# Patient Record
Sex: Female | Born: 1985 | Race: Black or African American | Hispanic: No | Marital: Married | Smoking: Never smoker
Health system: Southern US, Community
[De-identification: ages and names within clinical notes are randomized; demographics above are authoritative.]

## PROBLEM LIST (undated history)

## (undated) ENCOUNTER — Inpatient Hospital Stay (HOSPITAL_COMMUNITY): Payer: Self-pay

## (undated) DIAGNOSIS — M069 Rheumatoid arthritis, unspecified: Secondary | ICD-10-CM

## (undated) DIAGNOSIS — R87629 Unspecified abnormal cytological findings in specimens from vagina: Secondary | ICD-10-CM

## (undated) DIAGNOSIS — IMO0002 Reserved for concepts with insufficient information to code with codable children: Secondary | ICD-10-CM

## (undated) DIAGNOSIS — E559 Vitamin D deficiency, unspecified: Secondary | ICD-10-CM

## (undated) DIAGNOSIS — M329 Systemic lupus erythematosus, unspecified: Secondary | ICD-10-CM

## (undated) HISTORY — DX: Systemic lupus erythematosus, unspecified: M32.9

## (undated) HISTORY — DX: Reserved for concepts with insufficient information to code with codable children: IMO0002

## (undated) HISTORY — PX: MYRINGOTOMY: SUR874

## (undated) HISTORY — PX: BARTHOLIN GLAND CYST EXCISION: SHX565

## (undated) HISTORY — PX: WISDOM TOOTH EXTRACTION: SHX21

## (undated) HISTORY — PX: I & D EXTREMITY: SHX5045

---

## 2013-06-28 ENCOUNTER — Ambulatory Visit: Payer: BC Managed Care – PPO | Admitting: Family Medicine

## 2013-06-28 VITALS — BP 114/76 | HR 76 | Temp 98.6°F | Resp 16 | Ht 62.0 in | Wt 152.4 lb

## 2013-06-28 DIAGNOSIS — R300 Dysuria: Secondary | ICD-10-CM

## 2013-06-28 DIAGNOSIS — R350 Frequency of micturition: Secondary | ICD-10-CM

## 2013-06-28 LAB — POCT UA - MICROSCOPIC ONLY

## 2013-06-28 LAB — POCT URINE PREGNANCY: Preg Test, Ur: NEGATIVE

## 2013-06-28 LAB — POCT URINALYSIS DIPSTICK
Glucose, UA: 250
Spec Grav, UA: 1.015
pH, UA: 5

## 2013-06-28 MED ORDER — CIPROFLOXACIN HCL 250 MG PO TABS: 250.0000 mg | ORAL_TABLET | Freq: Two times a day (BID) | ORAL | Status: DC

## 2013-06-28 NOTE — Progress Notes (Signed)
Patient ID: Sheila Bates MRN: [id number], DOB: 06-30-1986, 27 y.o. Date of Encounter: 06/28/2013, 6:39 PM  Primary Physician: Michail Sermon, MD  Chief Complaint:  Chief Complaint  Patient presents with  . 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 Last UTI was 6 years ago  hematuria associated with menses LMP: now No sick contacts, recent antibiotics, or recent travels.   vaginal discharge, back pain  No past medical history on file.   Home Meds: Prior to Admission medications   Not on File    Allergies: No Known Allergies  History   Social History  . Marital Status: Married    Spouse Name: N/A    Number of Children: N/A  . Years of Education: N/A   Occupational History  . Not on file.   Social History Main Topics  . Smoking status: Never Smoker   . Smokeless tobacco: Not on file  . Alcohol Use: Not on file  . Drug Use: Not on file  . Sexual Activity: Not on file   Other Topics Concern  . Not on file   Social History Narrative  . No narrative on file     Review of Systems: Constitutional: negative for chills, fever, night sweats or weight changes Cardiovascular: negative for chest pain or palpitations Respiratory: negative for hemoptysis, wheezing, or shortness of breath Abdominal: negative for abdominal pain, nausea, vomiting or diarrhea Dermatological: negative for rash Neurologic: negative for headache   Physical Exam: Blood pressure 114/76, pulse 76, temperature 98.6 F (37 C), temperature source Oral, resp. rate 16, height 5\' 2"  (1.575 m), weight 152 lb 6.4 oz (69.128 kg), last menstrual period 06/03/2013, SpO2 98.00%., Body mass index is 27.87 kg/(m^2). General: Well developed, well nourished, in no acute distress. Head: Normocephalic, atraumatic, eyes without discharge, sclera  non-icteric, nares are congested. Bilateral auditory canals clear, TM's are without perforation, pearly grey with reflective cone of light bilaterally. Serous effusion bilaterally behind TM's. Maxillary sinus TTP. Oral cavity moist, dentition normal. Posterior pharynx with post nasal drip and mild erythema. No peritonsillar abscess or tonsillar exudate. Neck: Supple. No thyromegaly. Full ROM. No lymphadenopathy. Lungs: Coarse breath sounds bilaterally without Clear bilaterally to auscultation without wheezes, rales, or rhonchi. Breathing is unlabored.  Heart: RRR with S1 S2. No murmurs, rubs, or gallops appreciated. Abdomen: Soft, non-tender, non-distended with normoactive bowel sounds. No hepatosplenomegaly. No rebound/guarding. No obvious abdominal masses. McBurney's, Rovsing's, Iliopsoas, and table jar all negative. Msk:  Strength and tone normal for age. Extremities: No clubbing or cyanosis. No edema. Neuro: Alert and oriented X 3. Moves all extremities spontaneously. CNII-XII grossly in tact. Psych:  Responds to questions appropriately with a normal affect.   Labs: Results for orders placed in visit on 06/28/13  POCT UA - MICROSCOPIC ONLY      Result Value Range   WBC, Ur, HPF, POC tntc     RBC, urine, microscopic tntc     Bacteria, U Microscopic 3+     Mucus, UA trace     Epithelial cells, urine per micros 0-3     Crystals, Ur, HPF, POC neg     Casts, Ur, LPF, POC neg     Yeast, UA pos    POCT URINALYSIS DIPSTICK      Result Value Range   Color, UA orange  Clarity, UA turbin     Glucose, UA 250     Bilirubin, UA small     Ketones, UA 15     Spec Grav, UA 1.015     Blood, UA large     pH, UA 5.0     Protein, UA 100     Urobilinogen, UA 4.0     Nitrite, UA pos     Leukocytes, UA large (3+)    POCT URINE PREGNANCY      Result Value Range   Preg Test, Ur Negative        ASSESSMENT AND PLAN:  27 y.o. year old female with  - -Mucinex -Tylenol/Motrin  prn -Rest/fluids -RTC precautions -RTC 3-5 days if no improvement  Signed, Elvina Sidle, MD 06/28/2013 6:39 PM

## 2013-06-28 NOTE — Patient Instructions (Addendum)
Urinary Tract Infection  Urinary tract infections (UTIs) can develop anywhere along your urinary tract. Your urinary tract is your body's drainage system for removing wastes and extra water. Your urinary tract includes two kidneys, two ureters, a bladder, and a urethra. Your kidneys are a pair of bean-shaped organs. Each kidney is about the size of your fist. They are located below your ribs, one on each side of your spine.  CAUSES  Infections are caused by microbes, which are microscopic organisms, including fungi, viruses, and bacteria. These organisms are so small that they can only be seen through a microscope. Bacteria are the microbes that most commonly cause UTIs.  SYMPTOMS   Symptoms of UTIs may vary by age and gender of the patient and by the location of the infection. Symptoms in young women typically include a frequent and intense urge to urinate and a painful, burning feeling in the bladder or urethra during urination. Older women and men are more likely to be tired, shaky, and weak and have muscle aches and abdominal pain. A fever may mean the infection is in your kidneys. Other symptoms of a kidney infection include pain in your back or sides below the ribs, nausea, and vomiting.  DIAGNOSIS  To diagnose a UTI, your caregiver will ask you about your symptoms. Your caregiver also will ask to provide a urine sample. The urine sample will be tested for bacteria and white blood cells. White blood cells are made by your body to help fight infection.  TREATMENT   Typically, UTIs can be treated with medication. Because most UTIs are caused by a bacterial infection, they usually can be treated with the use of antibiotics. The choice of antibiotic and length of treatment depend on your symptoms and the type of bacteria causing your infection.  HOME CARE INSTRUCTIONS   If you were prescribed antibiotics, take them exactly as your caregiver instructs you. Finish the medication even if you feel better after you  have only taken some of the medication.   Drink enough water and fluids to keep your urine clear or pale yellow.   Avoid caffeine, tea, and carbonated beverages. They tend to irritate your bladder.   Empty your bladder often. Avoid holding urine for long periods of time.   Empty your bladder before and after sexual intercourse.   After a bowel movement, women should cleanse from front to back. Use each tissue only once.  SEEK MEDICAL CARE IF:    You have back pain.   You develop a fever.   Your symptoms do not begin to resolve within 3 days.  SEEK IMMEDIATE MEDICAL CARE IF:    You have severe back pain or lower abdominal pain.   You develop chills.   You have nausea or vomiting.   You have continued burning or discomfort with urination.  MAKE SURE YOU:    Understand these instructions.   Will watch your condition.   Will get help right away if you are not doing well or get worse.  Document Released: 07/31/2005 Document Revised: 04/21/2012 Document Reviewed: 11/29/2011  [employer] Patient Information 2014 [employer].

## 2013-09-20 ENCOUNTER — Ambulatory Visit: Payer: BC Managed Care – PPO | Admitting: Physician Assistant

## 2013-09-20 VITALS — BP 124/78 | HR 103 | Temp 98.1°F | Resp 16 | Ht 62.0 in | Wt 159.6 lb

## 2013-09-20 DIAGNOSIS — M329 Systemic lupus erythematosus, unspecified: Secondary | ICD-10-CM | POA: Insufficient documentation

## 2013-09-20 DIAGNOSIS — N926 Irregular menstruation, unspecified: Secondary | ICD-10-CM

## 2013-09-20 DIAGNOSIS — IMO0002 Reserved for concepts with insufficient information to code with codable children: Secondary | ICD-10-CM | POA: Insufficient documentation

## 2013-09-20 LAB — POCT URINE PREGNANCY: Preg Test, Ur: POSITIVE

## 2013-09-20 NOTE — Progress Notes (Signed)
  Subjective:    Patient ID: Sheila Bates, female    DOB: 1986/02/14, 27 y.o.   MRN: [id number]  HPI   Sheila Bates is a very pleasant 27 yr old female here requesting a pregnancy test.  She has had a positive home pregnancy test.  LMP 08/13/13.  She is married.  They stopped using birth control in May but have not necessarily been actively trying to conceive.  OBGYN is Dr. Loretha Brasil at [facility].  Pt reports she is high risk due to lupus.  Trying to get into [facility] but needs clinical confirmation of pregnancy faxed to that office prior to scheduling.  Reports rheumatologist is Dr. Enedina Finner in [place].  Not currently treated with any medication for lupus.  States she is feeling well.  Some mild nausea and light abd cramping.  She is taking prenatal vitamins.  Did drink alcohol 3 days ago before +pregnancy test, but is now abstaining.  Non smoker.  Denies vaginal bleeding, loss of fluids.  Needs results faxed to: [facility] Fax [phone]  Review of Systems  Constitutional: Negative for fever and chills.  Respiratory: Negative.   Cardiovascular: Negative.   Gastrointestinal: Positive for nausea.  Genitourinary: Negative for vaginal bleeding, vaginal discharge and pelvic pain.  Musculoskeletal: Negative.   Skin: Negative.   Neurological: Negative.        Objective:   Physical Exam  Vitals reviewed. Constitutional: She is oriented to person, place, and time. She appears well-developed and well-nourished. No distress.  HENT:  Head: Normocephalic and atraumatic.  Eyes: Conjunctivae are normal. No scleral icterus.  Cardiovascular: Normal rate, regular rhythm and normal heart sounds.   Pulmonary/Chest: Effort normal and breath sounds normal. She has no wheezes. She has no rales.  Abdominal: Soft. There is no tenderness.  Neurological: She is alert and oriented to person, place, and time.  Skin: Skin is warm and dry.  Psychiatric: She has a normal mood and affect.  Her behavior is normal.    Results for orders placed in visit on 09/20/13  POCT URINE PREGNANCY      Result Value Range   Preg Test, Ur Positive         Assessment & Plan:  Missed period - Plan: POCT urine pregnancy  Lupus   Sheila Bates is a very pleasant 27 yr old female here with amenorrhea and a positive home pregnancy test.  Urine hcg in clinic today is also positive.  Pt reports a history of lupus making her pregnancy high risk.  Attempting to make an appt with [facility].  Pt currently feels well aside from some mild nausea.  Discussed avoidance of etoh, tobacco.  Continue prenatal vit daily.  Pt to RTC if concerns arise prior to establish with OBGYN.  Loleta Dicker MHS, PA-C [facility] 11/17/20143:37 PM

## 2013-09-20 NOTE — Patient Instructions (Signed)
Continue taking daily prenatal vitamins.  Drink plenty of water.  Make healthy dietary choices.  Avoid alcohol and tobacco.  Please come back in if you experience abdominal pain, vaginal bleeding, etc.   Pregnancy If you are planning on getting pregnant, it is a good idea to make a preconception appointment with your caregiver to discuss having a healthy lifestyle before getting pregnant. This includes diet, weight, exercise, taking prenatal vitamins (especially folic acid, which helps prevent brain and spinal cord defects), avoiding alcohol, smoking and illegal drugs, medical problems (diabetes, convulsions), family history of genetic problems, working conditions, and immunizations. It is better to have knowledge of these things and do something about them before getting pregnant. During your pregnancy, it is important to follow certain guidelines in order to have a healthy baby. It is very important to get good prenatal care and follow your caregiver's instructions. Prenatal care includes all the medical care you receive before your baby's birth. This helps to prevent problems during the pregnancy and childbirth. HOME CARE INSTRUCTIONS   Start your prenatal visits by the 12th week of pregnancy or earlier, if possible. At first, appointments are usually scheduled monthly. They become more frequent in the last 2 months before delivery. It is important that you keep your caregiver's appointments and follow your caregiver's instructions regarding medication use, exercise, and diet.  During pregnancy, you are providing food for you and your baby. Eat a regular, well-balanced diet. Choose foods such as meat, fish, milk and other dairy products, vegetables, fruits, whole-grain breads and cereals. Your caregiver will inform you of the ideal weight gain depending on your current height and weight. Drink lots of liquids. Try to drink 8 glasses of water a day.  Alcohol is associated with a number of birth defects  including fetal alcohol syndrome. It is best to avoid alcohol completely. Smoking will cause low birth rate and prematurity. Use of alcohol and nicotine during your pregnancy also increases the chances that your child will be chemically dependent later in their life and may contribute to SIDS (Sudden Infant Death Syndrome).  Do not use illegal drugs.  Only take prescription or over-the-counter medications that are recommended by your caregiver. Other medications can cause genetic and physical problems in the baby.  Morning sickness can often be helped by keeping soda crackers at the bedside. Eat a few before getting up in the morning.  A sexual relationship may be continued until near the end of pregnancy if there are no other problems such as early (premature) leaking of amniotic fluid from the membranes, vaginal bleeding, painful intercourse or belly (abdominal) pain.  Exercise regularly. Check with your caregiver if you are unsure of the safety of some of your exercises.  Do not use hot tubs, steam rooms or saunas. These increase the risk of fainting and hurting yourself and the baby. Swimming is OK for exercise. Get plenty of rest, including afternoon naps when possible, especially in the third trimester.  Avoid toxic odors and chemicals.  Do not wear high heels. They may cause you to lose your balance and fall.  Do not lift over 5 pounds. If you do lift anything, lift with your legs and thighs, not your back.  Avoid long trips, especially in the third trimester.  If you have to travel out of the city or state, take a copy of your medical records with you. SEEK IMMEDIATE MEDICAL CARE IF:   You develop an unexplained oral temperature above 102 F (38.9 C), or  as your caregiver suggests.  You have leaking of fluid from the vagina. If leaking membranes are suspected, take your temperature and inform your caregiver of this when you call.  There is vaginal spotting or bleeding. Notify  your caregiver of the amount and how many pads are used.  You continue to feel sick to your stomach (nauseous) and have no relief from remedies suggested, or you throw up (vomit) blood or coffee ground like materials.  You develop upper abdominal pain.  You have round ligament discomfort in the lower abdominal area. This still must be evaluated by your caregiver.  You feel contractions of the uterus.  You do not feel the baby move, or there is less movement than before.  You have painful urination.  You have abnormal vaginal discharge.  You have persistent diarrhea.  You get a severe headache.  You have problems with your vision.  You develop muscle weakness.  You feel dizzy and faint.  You develop shortness of breath.  You develop chest pain.  You have back pain that travels down to your leg and feet.  You feel irregular or a very fast heartbeat.  You develop excessive weight gain in a short period of time (5 pounds in 3 to 5 days).  You are involved in a domestic violence situation. Document Released: 10/21/2005 Document Revised: 04/21/2012 Document Reviewed: 04/14/2009 [facility] Patient Information 2014 [place], [place].

## 2013-10-08 LAB — OB RESULTS CONSOLE HIV ANTIBODY (ROUTINE TESTING): HIV: NONREACTIVE

## 2013-10-08 LAB — OB RESULTS CONSOLE ABO/RH: RH TYPE: POSITIVE

## 2013-10-08 LAB — OB RESULTS CONSOLE RPR: RPR: NONREACTIVE

## 2013-10-08 LAB — OB RESULTS CONSOLE RUBELLA ANTIBODY, IGM: Rubella: IMMUNE

## 2013-10-08 LAB — OB RESULTS CONSOLE HEPATITIS B SURFACE ANTIGEN: HEP B S AG: NEGATIVE

## 2013-10-08 LAB — OB RESULTS CONSOLE ANTIBODY SCREEN: ANTIBODY SCREEN: NEGATIVE

## 2013-11-18 ENCOUNTER — Ambulatory Visit: Payer: BC Managed Care – PPO | Admitting: Physician Assistant

## 2013-11-18 VITALS — BP 102/60 | HR 89 | Temp 98.9°F | Resp 18 | Ht 62.5 in | Wt 156.0 lb

## 2013-11-18 DIAGNOSIS — B9789 Other viral agents as the cause of diseases classified elsewhere: Principal | ICD-10-CM

## 2013-11-18 DIAGNOSIS — J069 Acute upper respiratory infection, unspecified: Secondary | ICD-10-CM

## 2013-11-18 MED ORDER — IPRATROPIUM BROMIDE 0.03 % NA SOLN: 2.0000 | Freq: Two times a day (BID) | NASAL | Status: DC

## 2013-11-18 MED ORDER — HYDROCODONE-HOMATROPINE 5-1.5 MG/5ML PO SYRP: 5.0000 mL | ORAL_SOLUTION | Freq: Three times a day (TID) | ORAL | Status: DC | PRN

## 2013-11-18 MED ORDER — DEXTROMETHORPHAN-GUAIFENESIN 10-100 MG/5ML PO LIQD: 5.0000 mL | ORAL | Status: DC | PRN

## 2013-11-18 NOTE — Progress Notes (Signed)
   Subjective:    Patient ID: Sheila Bates, female    DOB: [birth year], 28 y.o.   MRN: [id number]  HPI   Ms. Johal is a very pleasant 28 yr old female here with concern for illness.  Reports 1 wk of congested nose, sore throat, productive cough.  Has CP with cough now.  Cough keeps awake at night.  No fever, chills.  No GI symptoms.  Has been using Tylenol Cold and Sinus with some relief.  +sick contacts at work.  Pt is [redacted] wks pregnant   Review of Systems  Constitutional: Negative for fever and chills.  HENT: Positive for congestion, rhinorrhea and sore throat. Negative for ear pain.   Respiratory: Positive for cough. Negative for shortness of breath and wheezing.   Cardiovascular: Negative.   Gastrointestinal: Negative.   Musculoskeletal: Negative.   Skin: Negative.        Objective:   Physical Exam  Vitals reviewed. Constitutional: She is oriented to person, place, and time. She appears well-developed and well-nourished. No distress.  HENT:  Head: Normocephalic and atraumatic.  Right Ear: Tympanic membrane and ear canal normal.  Left Ear: Tympanic membrane and ear canal normal.  Mouth/Throat: Uvula is midline, oropharynx is clear and moist and mucous membranes are normal.  Eyes: Conjunctivae are normal. No scleral icterus.  Neck: Neck supple.  Cardiovascular: Normal rate, regular rhythm and normal heart sounds.   Pulmonary/Chest: Effort normal and breath sounds normal. She has no wheezes. She has no rales.  Lymphadenopathy:    She has no cervical adenopathy.  Neurological: She is alert and oriented to person, place, and time.  Skin: Skin is warm and dry.  Psychiatric: She has a normal mood and affect. Her behavior is normal.        Assessment & Plan:  Viral URI with cough - Plan: ipratropium (ATROVENT) 0.03 % nasal spray, HYDROcodone-homatropine (HYCODAN) 5-1.5 MG/5ML syrup, dextromethorphan-guaiFENesin (ROBITUSSIN-DM) 10-100 MG/5ML liquid   Ms. Lavoy is a pleasant  28 yr old female here with 1 wk of URI symptoms.  Nasal symptoms resolving, now cough most bothersome.  Suspect viral etiology. Afebrile, throat care, lungs CTA.  Will treat symptomatically at this point.  Atrovent nasal, robitussin dm.  Hycodan if needed for night time cough - discussed with pt that this is pregnancy category C.  Pt understands and agrees with this plan.  If worsening or not improving in 3-4 days, would treat for sinusitis with augmentin   E. Frances Furbish MHS, PA-C [facility] 1/15/20159:50 PM

## 2013-11-18 NOTE — Patient Instructions (Signed)
Begin using the ipratropium nasal spray 2-3 times per day to help with congestion, runny nose, and post-nasal drainage.  Use the dextromethorphan-guaifenesin every 6 hours to help with cough - this will also help clear up congestion  If cough is really bothersome at night, you can use the Hycodan syrup - this is pregnancy category C, so would prefer to limit this if possible  Drink plenty of fluids (water is best!) and get plenty of rest  If any symptoms are worsening or not improving in the next 3-4 days please call   Upper Respiratory Infection, Adult An upper respiratory infection (URI) is also sometimes known as the common cold. The upper respiratory tract includes the nose, sinuses, throat, trachea, and bronchi. Bronchi are the airways leading to the lungs. Most people improve within 1 week, but symptoms can last up to 2 weeks. A residual cough may last even longer.  CAUSES Many different viruses can infect the tissues lining the upper respiratory tract. The tissues become irritated and inflamed and often become very moist. Mucus production is also common. A cold is contagious. You can easily spread the virus to others by oral contact. This includes kissing, sharing a glass, coughing, or sneezing. Touching your mouth or nose and then touching a surface, which is then touched by another person, can also spread the virus. SYMPTOMS  Symptoms typically develop 1 to 3 days after you come in contact with a cold virus. Symptoms vary from person to person. They may include:  Runny nose.  Sneezing.  Nasal congestion.  Sinus irritation.  Sore throat.  Loss of voice (laryngitis).  Cough.  Fatigue.  Muscle aches.  Loss of appetite.  Headache.  Low-grade fever. DIAGNOSIS  You might diagnose your own cold based on familiar symptoms, since most people get a cold 2 to 3 times a year. Your caregiver can confirm this based on your exam. Most importantly, your caregiver can check that  your symptoms are not due to another disease such as strep throat, sinusitis, pneumonia, asthma, or epiglottitis. Blood tests, throat tests, and X-rays are not necessary to diagnose a common cold, but they may sometimes be helpful in excluding other more serious diseases. Your caregiver will decide if any further tests are required. RISKS AND COMPLICATIONS  You may be at risk for a more severe case of the common cold if you smoke cigarettes, have chronic heart disease (such as heart failure) or lung disease (such as asthma), or if you have a weakened immune system. The very young and very old are also at risk for more serious infections. Bacterial sinusitis, middle ear infections, and bacterial pneumonia can complicate the common cold. The common cold can worsen asthma and chronic obstructive pulmonary disease (COPD). Sometimes, these complications can require emergency medical care and may be life-threatening. PREVENTION  The best way to protect against getting a cold is to practice good hygiene. Avoid oral or hand contact with people with cold symptoms. Wash your hands often if contact occurs. There is no clear evidence that vitamin C, vitamin E, echinacea, or exercise reduces the chance of developing a cold. However, it is always recommended to get plenty of rest and practice good nutrition. TREATMENT  Treatment is directed at relieving symptoms. There is no cure. Antibiotics are not effective, because the infection is caused by a virus, not by bacteria. Treatment may include:  Increased fluid intake. Sports drinks offer valuable electrolytes, sugars, and fluids.  Breathing heated mist or steam (vaporizer or shower).  Eating chicken soup or other clear broths, and maintaining good nutrition.  Getting plenty of rest.  Using gargles or lozenges for comfort.  Controlling fevers with ibuprofen or acetaminophen as directed by your caregiver.  Increasing usage of your inhaler if you have  asthma. Zinc gel and zinc lozenges, taken in the first 24 hours of the common cold, can shorten the duration and lessen the severity of symptoms. Pain medicines may help with fever, muscle aches, and throat pain. A variety of non-prescription medicines are available to treat congestion and runny nose. Your caregiver can make recommendations and may suggest nasal or lung inhalers for other symptoms.  HOME CARE INSTRUCTIONS   Only take over-the-counter or prescription medicines for pain, discomfort, or fever as directed by your caregiver.  Use a warm mist humidifier or inhale steam from a shower to increase air moisture. This may keep secretions moist and make it easier to breathe.  Drink enough water and fluids to keep your urine clear or pale yellow.  Rest as needed.  Return to work when your temperature has returned to normal or as your caregiver advises. You may need to stay home longer to avoid infecting others. You can also use a face mask and careful hand washing to prevent spread of the virus. SEEK MEDICAL CARE IF:   After the first few days, you feel you are getting worse rather than better.  You need your caregiver's advice about medicines to control symptoms.  You develop chills, worsening shortness of breath, or brown or red sputum. These may be signs of pneumonia.  You develop yellow or brown nasal discharge or pain in the face, especially when you bend forward. These may be signs of sinusitis.  You develop a fever, swollen neck glands, pain with swallowing, or white areas in the back of your throat. These may be signs of strep throat. SEEK IMMEDIATE MEDICAL CARE IF:   You have a fever.  You develop severe or persistent headache, ear pain, sinus pain, or chest pain.  You develop wheezing, a prolonged cough, cough up blood, or have a change in your usual mucus (if you have chronic lung disease).  You develop sore muscles or a stiff neck. Document Released: 04/16/2001  Document Revised: 01/13/2012 Document Reviewed: 02/22/2011 [facility] Patient Information 2014 [place], [place].

## 2013-12-15 LAB — OB RESULTS CONSOLE GC/CHLAMYDIA
Chlamydia: NEGATIVE
Gonorrhea: NEGATIVE

## 2014-01-05 ENCOUNTER — Ambulatory Visit (INDEPENDENT_AMBULATORY_CARE_PROVIDER_SITE_OTHER): Payer: BC Managed Care – PPO | Admitting: General Surgery

## 2014-01-05 ENCOUNTER — Ambulatory Visit: Payer: BC Managed Care – PPO | Admitting: Family Medicine

## 2014-01-05 ENCOUNTER — Encounter (INDEPENDENT_AMBULATORY_CARE_PROVIDER_SITE_OTHER): Payer: Self-pay

## 2014-01-05 ENCOUNTER — Encounter (INDEPENDENT_AMBULATORY_CARE_PROVIDER_SITE_OTHER): Payer: Self-pay | Admitting: General Surgery

## 2014-01-05 VITALS — BP 100/60 | HR 84 | Temp 98.1°F | Resp 18 | Ht 62.0 in | Wt 167.0 lb

## 2014-01-05 VITALS — BP 122/84 | HR 74 | Temp 97.7°F | Resp 16 | Ht 63.0 in | Wt 169.8 lb

## 2014-01-05 DIAGNOSIS — L02419 Cutaneous abscess of limb, unspecified: Secondary | ICD-10-CM

## 2014-01-05 DIAGNOSIS — L0291 Cutaneous abscess, unspecified: Secondary | ICD-10-CM

## 2014-01-05 DIAGNOSIS — L0390 Cellulitis, unspecified: Principal | ICD-10-CM

## 2014-01-05 DIAGNOSIS — L03119 Cellulitis of unspecified part of limb: Principal | ICD-10-CM

## 2014-01-05 LAB — POCT CBC
GRANULOCYTE PERCENT: 81.8 % — AB (ref 37–80)
HCT, POC: 37.8 % (ref 37.7–47.9)
Hemoglobin: 11.7 g/dL — AB (ref 12.2–16.2)
LYMPH, POC: 1.7 (ref 0.6–3.4)
MCH: 29.3 pg (ref 27–31.2)
MCHC: 31 g/dL — AB (ref 31.8–35.4)
MCV: 94.8 fL (ref 80–97)
MID (cbc): 0.8 (ref 0–0.9)
MPV: 9.1 fL (ref 0–99.8)
POC Granulocyte: 11.5 — AB (ref 2–6.9)
POC LYMPH %: 12.4 % (ref 10–50)
POC MID %: 5.8 %M (ref 0–12)
Platelet Count, POC: 240 10*3/uL (ref 142–424)
RBC: 3.99 M/uL — AB (ref 4.04–5.48)
RDW, POC: 14.3 %
WBC: 14.1 10*3/uL — AB (ref 4.6–10.2)

## 2014-01-05 MED ORDER — CEPHALEXIN 500 MG PO CAPS: 500.0000 mg | ORAL_CAPSULE | Freq: Four times a day (QID) | ORAL | Status: DC

## 2014-01-05 NOTE — Patient Instructions (Signed)
Home Instructions Following Incision and Drainage of Abscess  Wound care - A dressing has been applied to control any bleeding or drainage immediately after your procedure. There is packing inside your wound as well that should be removed with the dressing.  You need to repack the area.  After the dressing is removed, clean the area gently with a mild soap and warm water and pack with guaze.  Cover with a dry dressing.   - Call the office if the surrounding redness doesn't get better over the next couple.      Diet -Eat a regular diet.  Avoid foods that may constipate you or give you diarrhea.  Drink 6-8 glasses of water a day and avoid seeds, nuts and popcorn until the area heals.  Medication - We recommend Extra Strength Tylenol for mild to moderate pain.  This can be taken as instructed on the bottle.   - If you are given a prescription for antibiotics, take as instructed by your doctor until the entire course is completed  Activity Resume activities as tolerated beginning tomorrow.  Avoid strenuous activities or sports for one week.    Call the office if you have any questions.  Call IMMEDIATELY if you should develop persistent heavy rectal bleeding, increase in pain, difficulty urinating or fever greater than 100 F.

## 2014-01-05 NOTE — Patient Instructions (Addendum)
We are going to have you see general surgery today due to your abscess in the left groin.  Please go to the office of [facility] by 315. Please bring your insurance card and photo ID.  You will see Dr. Maisie Fus [address] [place], [place] [address] phone: [phone]

## 2014-01-05 NOTE — Progress Notes (Signed)
Chief Complaint  Patient presents with  . Other  . Pain    HISTORY:  Sheila Bates is a 28 y.o. female who presents to the office with an enalrging L inner thigh mass.  She noticed a cyst that opened up and drained about 2 weeks ago.  It came back yesterday.  It is tender to Bates.  She has had one episode of this in the past.  She does have a h/o lupus and is [redacted] wks pregnant.  She is on steroids for her lupus during pregnancy.    Past Medical History  Diagnosis Date  . Lupus        History reviewed. No pertinent past surgical history.    Current Outpatient Prescriptions  Medication Sig Dispense Refill  . aspirin 81 MG tablet Take 81 mg by mouth daily.      Marland Kitchen dexamethasone (DECADRON) 4 MG tablet Take 4 mg by mouth 2 (two) times daily with a meal.      . dextromethorphan-guaiFENesin (ROBITUSSIN-DM) 10-100 MG/5ML liquid Take 5 mLs by mouth every 4 (four) hours as needed for cough.  120 mL  0  . HYDROcodone-homatropine (HYCODAN) 5-1.5 MG/5ML syrup Take 5 mLs by mouth every 8 (eight) hours as needed for cough.  30 mL  0  . hydroxychloroquine (PLAQUENIL) 200 MG tablet Take 400 mg by mouth daily.      Marland Kitchen ipratropium (ATROVENT) 0.03 % nasal spray Place 2 sprays into the nose 2 (two) times daily.  30 mL  1  . Prenatal Vit-Fe Fumarate-FA (PRENATAL MULTIVITAMIN) TABS tablet Take 1 tablet by mouth daily at 12 noon.      . pseudoephedrine-acetaminophen (TYLENOL SINUS) 30-500 MG TABS Take 1 tablet by mouth every 4 (four) hours as needed.      . cephALEXin (KEFLEX) 500 MG capsule Take 1 capsule (500 mg total) by mouth 4 (four) times daily.  40 capsule  0   No current facility-administered medications for this visit.     No Known Allergies    History reviewed. No pertinent family history.    History   Social History  . Marital Status: Married    Spouse Name: N/A    Number of Children: N/A  . Years of Education: N/A   Social History Main Topics  . Smoking status: Never Smoker   .  Smokeless tobacco: None  . Alcohol Use: None  . Drug Use: None  . Sexual Activity: None   Other Topics Concern  . None   Social History Narrative  . None       REVIEW OF SYSTEMS - PERTINENT POSITIVES ONLY: Review of Systems - General ROS: negative for - chills or fever Hematological and Lymphatic ROS: negative for - bleeding problems or blood clots Respiratory ROS: no cough, shortness of breath, or wheezing Cardiovascular ROS: no chest pain or dyspnea on exertion Gastrointestinal ROS: no abdominal pain, change in bowel habits, or black or bloody stools Genito-Urinary ROS: no dysuria, trouble voiding, or hematuria Musculoskeletal ROS: negative for - joint pain, joint stiffness or joint swelling  EXAM: Filed Vitals:   01/05/14 1547  BP: 122/84  Pulse: 74  Temp: 97.7 F (36.5 C)  Resp: 16    General appearance: alert and cooperative Resp: clear to auscultation bilaterally Cardio: regular rate and rhythm GI: normal findings: soft, non-tender Erythematous mass in inner L thigh.  Fluctuance noted.    LABORATORY RESULTS: Available labs are reviewed  Lab Results  Component Value Date   WBC 14.1*  01/05/2014   HGB 11.7* 01/05/2014   HCT 37.8 01/05/2014   MCV 94.8 01/05/2014   Procedure: I&D Surgeon: Maisie Fus Assistant: Morris After the risks and benefits were explained, verbal consent was obtained for above procedure  Anesthesia: none Diagnosis: L thigh abscess Description: The area over the abscess was prepped with ChloraPrep.  The area was then infused with subcutaneous Marcaine. A cruciate incision was made using 11 blade scalpel. The corners were trimmed off.  A large amount of purulence was removed. The cavity was then packed with 1 inch iodoform gauze. A dressing was applied. The patient tolerated the procedure well. Discharge instructions were given.   ASSESSMENT AND PLAN: Sheila Bates is a 28 y.o. F who is pregnant and has lupus who presents with an enlarging L thigh  abscess.  We have performed an I&D in the office. I've asked her to pack this wound daily. I have given her a prescription for Keflex.  I will see her back in 2 weeks.    Vanita Panda, MD Colon and Rectal Surgery / General Surgery [facility]      Visit Diagnoses: 1. Cellulitis and abscess     Primary Care Physician: Michail Sermon, MD

## 2014-01-05 NOTE — Progress Notes (Addendum)
[facility] [address], [place] [address] [id number] [phone]  Date:  01/05/2014   Name:  Sheila Bates   DOB:  04/05/1986   MRN:  [id number]  PCP:  Michail Sermon, MD    Chief Complaint: Recurrent Skin Infections   History of Present Illness:  Sheila Bates is a 28 y.o. very pleasant female patient who presents with the following:  Here today with a "cyst" on her leg.  She has a history of lupus; she is treated per [facility]  She is also pregnant.  She will be 21 weeks tomorrow; her OBG is Birdie Hopes with [facility].   She first noted this "cyst, or a boil" about 2 weeks ago- it then drained and she thought it was better.  However it came back  back again yesterday and is "now huge."  It is quite tender.  She has tried hot compresses but has not been able to get the abscess to drain.  She has not noted any fever.    She otherwise feels well.   She has had a few abscesses  in the past, but has not been bothered by abscesses since her 80s.    She is G1P0. Pregnancy is going well; she is feeling her baby move as normal.  No problems with contractions, bleeding or LOF.  She is expecting a baby girl.   Patient Active Problem List   Diagnosis Date Noted  . Lupus 09/20/2013    Past Medical History  Diagnosis Date  . Lupus     History reviewed. No pertinent past surgical history.  History  Substance Use Topics  . Smoking status: Never Smoker   . Smokeless tobacco: Not on file  . Alcohol Use: Not on file    History reviewed. No pertinent family history.  No Known Allergies  Medication list has been reviewed and updated.  Current Outpatient Prescriptions on File Prior to Visit  Medication Sig Dispense Refill  . dextromethorphan-guaiFENesin (ROBITUSSIN-DM) 10-100 MG/5ML liquid Take 5 mLs by mouth every 4 (four) hours as needed for cough.  120 mL  0  . HYDROcodone-homatropine (HYCODAN) 5-1.5 MG/5ML syrup Take 5 mLs by mouth  every 8 (eight) hours as needed for cough.  30 mL  0  . ipratropium (ATROVENT) 0.03 % nasal spray Place 2 sprays into the nose 2 (two) times daily.  30 mL  1  . pseudoephedrine-acetaminophen (TYLENOL SINUS) 30-500 MG TABS Take 1 tablet by mouth every 4 (four) hours as needed.       No current facility-administered medications on file prior to visit.    Review of Systems:  As per HPI- otherwise negative.   Physical Examination: Filed Vitals:   01/05/14 0911  BP: 100/60  Pulse: 110  Temp: 98.1 F (36.7 C)  Resp: 18   Filed Vitals:   01/05/14 0911  Height: 5\' 2"  (1.575 m)  Weight: 167 lb (75.751 kg)   Body mass index is 30.54 kg/(m^2). Ideal Body Weight: Weight in (lb) to have BMI = 25: 136.4  GEN: WDWN, NAD, Non-toxic, A & O x 3, looks well HEENT: Atraumatic, Normocephalic. Neck supple. No masses, No LAD. Ears and Nose: No external deformity. CV: RRR, No M/G/R. No JVD. No thrill. No extra heart sounds. PULM: CTA B, no wheezes, crackles, rhonchi. No retractions. No resp. distress. No accessory muscle use. ABD: gravid abdomen, normal with fundus just above umbilicus.   EXTR: No c/c/e NEURO Normal gait.  PSYCH:  Normally interactive. Conversant. Not depressed or anxious appearing.  Calm demeanor.  Left groin: she has a tender, warm, indurated area without definite fluctuance.   auscultated fetal heart tones.  Normal, approx 140 BPM  Results for orders placed in visit on 01/05/14  POCT CBC      Result Value Ref Range   WBC 14.1 (*) 4.6 - 10.2 K/uL   Lymph, poc 1.7  0.6 - 3.4   POC LYMPH PERCENT 12.4  10 - 50 %L   MID (cbc) 0.8  0 - 0.9   POC MID % 5.8  0 - 12 %M   POC Granulocyte 11.5 (*) 2 - 6.9   Granulocyte percent 81.8 (*) 37 - 80 %G   RBC 3.99 (*) 4.04 - 5.48 M/uL   Hemoglobin 11.7 (*) 12.2 - 16.2 g/dL   HCT, POC 85.6  31.4 - 47.9 %   MCV 94.8  80 - 97 fL   MCH, POC 29.3  27 - 31.2 pg   MCHC 31.0 (*) 31.8 - 35.4 g/dL   RDW, POC 97.0     Platelet Count, POC 240   142 - 424 K/uL   MPV 9.1  0 - 99.8 fL    Assessment and Plan: Cellulitis and abscess of leg - Plan: POCT CBC  Referral to see general surgery today- appreciate their kind consultation for this nice patient.    Signed Abbe Amsterdam, MD

## 2014-01-26 ENCOUNTER — Encounter (INDEPENDENT_AMBULATORY_CARE_PROVIDER_SITE_OTHER): Payer: BC Managed Care – PPO | Admitting: General Surgery

## 2014-02-14 LAB — OB RESULTS CONSOLE RPR: RPR: NONREACTIVE

## 2014-02-14 LAB — OB RESULTS CONSOLE HIV ANTIBODY (ROUTINE TESTING): HIV: NONREACTIVE

## 2014-04-20 LAB — OB RESULTS CONSOLE GBS: GBS: NEGATIVE

## 2014-05-11 ENCOUNTER — Encounter (HOSPITAL_COMMUNITY): Payer: Self-pay | Admitting: *Deleted

## 2014-05-11 ENCOUNTER — Inpatient Hospital Stay (HOSPITAL_COMMUNITY)
Admission: AD | Admit: 2014-05-11 | Discharge: 2014-05-15 | DRG: 766 | Disposition: A | Discharge status: Home / Self Care | Payer: BC Managed Care – PPO | Source: Ambulatory Visit | Attending: Obstetrics and Gynecology | Admitting: Obstetrics and Gynecology

## 2014-05-11 DIAGNOSIS — O99892 Other specified diseases and conditions complicating childbirth: Secondary | ICD-10-CM | POA: Diagnosis present

## 2014-05-11 DIAGNOSIS — O324XX0 Maternal care for high head at term, not applicable or unspecified: Secondary | ICD-10-CM | POA: Diagnosis present

## 2014-05-11 DIAGNOSIS — Z833 Family history of diabetes mellitus: Secondary | ICD-10-CM

## 2014-05-11 DIAGNOSIS — Z87891 Personal history of nicotine dependence: Secondary | ICD-10-CM

## 2014-05-11 DIAGNOSIS — O4290 Premature rupture of membranes, unspecified as to length of time between rupture and onset of labor, unspecified weeks of gestation: Principal | ICD-10-CM | POA: Diagnosis present

## 2014-05-11 DIAGNOSIS — M329 Systemic lupus erythematosus, unspecified: Secondary | ICD-10-CM

## 2014-05-11 DIAGNOSIS — O9989 Other specified diseases and conditions complicating pregnancy, childbirth and the puerperium: Secondary | ICD-10-CM

## 2014-05-11 DIAGNOSIS — Z803 Family history of malignant neoplasm of breast: Secondary | ICD-10-CM

## 2014-05-11 HISTORY — DX: Unspecified abnormal cytological findings in specimens from vagina: R87.629

## 2014-05-11 LAB — CBC
HEMATOCRIT: 35.8 % — AB (ref 36.0–46.0)
Hemoglobin: 12 g/dL (ref 12.0–15.0)
MCH: 29.9 pg (ref 26.0–34.0)
MCHC: 33.5 g/dL (ref 30.0–36.0)
MCV: 89.1 fL (ref 78.0–100.0)
PLATELETS: 206 10*3/uL (ref 150–400)
RBC: 4.02 MIL/uL (ref 3.87–5.11)
RDW: 12.6 % (ref 11.5–15.5)
WBC: 5.6 10*3/uL (ref 4.0–10.5)

## 2014-05-11 MED ORDER — OXYTOCIN 40 UNITS IN LACTATED RINGERS INFUSION - SIMPLE MED
62.5000 mL/h | INTRAVENOUS | Status: DC
  Filled 2014-05-11: qty 1000

## 2014-05-11 MED ORDER — LACTATED RINGERS IV SOLN
500.0000 mL | INTRAVENOUS | Status: DC | PRN
  Administered 2014-05-12 (×3): 1000 mL via INTRAVENOUS

## 2014-05-11 MED ORDER — HYDROCORTISONE NA SUCCINATE PF 100 MG IJ SOLR
50.0000 mg | Freq: Three times a day (TID) | INTRAMUSCULAR | Status: AC
  Administered 2014-05-11 – 2014-05-13 (×5): 50 mg via INTRAVENOUS
  Filled 2014-05-11 (×6): qty 1

## 2014-05-11 MED ORDER — LIDOCAINE HCL (PF) 1 % IJ SOLN
30.0000 mL | INTRAMUSCULAR | Status: DC | PRN
  Filled 2014-05-11: qty 30

## 2014-05-11 MED ORDER — CITRIC ACID-SODIUM CITRATE 334-500 MG/5ML PO SOLN
30.0000 mL | ORAL | Status: DC | PRN
  Administered 2014-05-12: 30 mL via ORAL
  Filled 2014-05-11: qty 15

## 2014-05-11 MED ORDER — IBUPROFEN 600 MG PO TABS: 600.0000 mg | ORAL_TABLET | Freq: Four times a day (QID) | ORAL | Status: DC | PRN

## 2014-05-11 MED ORDER — ONDANSETRON HCL 4 MG/2ML IJ SOLN: 4.0000 mg | Freq: Four times a day (QID) | INTRAMUSCULAR | Status: DC | PRN

## 2014-05-11 MED ORDER — ACETAMINOPHEN 325 MG PO TABS: 650.0000 mg | ORAL_TABLET | ORAL | Status: DC | PRN

## 2014-05-11 MED ORDER — FLEET ENEMA 7-19 GM/118ML RE ENEM: 1.0000 | ENEMA | RECTAL | Status: DC | PRN

## 2014-05-11 MED ORDER — LACTATED RINGERS IV SOLN
INTRAVENOUS | Status: DC
  Administered 2014-05-11 – 2014-05-12 (×5): via INTRAVENOUS

## 2014-05-11 MED ORDER — OXYTOCIN BOLUS FROM INFUSION
500.0000 mL | INTRAVENOUS | Status: DC
Start: 2014-05-11 — End: 2014-05-13

## 2014-05-11 MED ORDER — TERBUTALINE SULFATE 1 MG/ML IJ SOLN: 0.2500 mg | Freq: Once | INTRAMUSCULAR | Status: AC | PRN

## 2014-05-11 MED ORDER — OXYTOCIN 40 UNITS IN LACTATED RINGERS INFUSION - SIMPLE MED
1.0000 m[IU]/min | INTRAVENOUS | Status: DC
  Administered 2014-05-11: 1 m[IU]/min via INTRAVENOUS
  Filled 2014-05-11: qty 1000

## 2014-05-11 MED ORDER — OXYCODONE-ACETAMINOPHEN 5-325 MG PO TABS: 1.0000 | ORAL_TABLET | ORAL | Status: DC | PRN

## 2014-05-11 NOTE — Progress Notes (Signed)
Patient ID: Sheila Bates, female   DOB: 04-22-1986, 28 y.o.   MRN: [id number] Pt is admitted with SLE and PROM. sHE HAS BEEN PLACED ON PITOCIN AND WILL RECEIVE SOLU CORTEF 50 MG Q 8H X 2 DAYS

## 2014-05-11 NOTE — H&P (Signed)
Sheila Bates, HRITZ NO.:  [id number]  MEDICAL RECORD NO.:  [id number]  LOCATION:  9164                          FACILITY:  [facility]  PHYSICIAN:  Malachi Pro. Ambrose Mantle, M.D. DATE OF BIRTH:  [birth year]  DATE OF ADMISSION:  05/11/2014 DATE OF DISCHARGE:                             HISTORY & PHYSICAL   PRESENT ILLNESS:  This is a 28 year old black female, para 0-0-1-0, gravida 2, [facility] May 20, 2014, admitted with premature rupture of the membranes.  Blood group and type O positive.  Negative antibody.  RPR negative.  Urine culture negative.  Hepatitis B surface antigen negative.  HIV negative.  GC and Chlamydia negative.  Rubella immune. Hemoglobin electrophoresis AA.  Pap smear normal.  Cystic fibrosis screen negative.  Second trimester screen for AFP was negative. Panorama screen showed panorama risks were less than 1 in 10,000 for trisomy 12, trisomy 25, trisomy 50, monosomy X, and 22q11.2 deletion syndrome, risk of 1 in 13,300.  The patient began her prenatal course at 11 weeks and 4 days.  She had lupus erythematosus, on Plaquenil and baby aspirin.  Fetal echo at 16 weeks showed some tricuspid insufficiency. She followed the echocardiogram of the heart every week.  At the moment was placed on dexamethasone, tricuspid regurgitation was improved. Patient's 1-hour Glucola screen was 141.  She had a 3-hour test, it was 76, 119, 102, and 42.  The patient was seen in the cardiology clinic every week and felt like the patient needed stress dose steroids with delivery and monitoring of the baby's heartbeat after delivery.  An ultrasound for size less than dates on 07/02 showed the baby to be in the 28th percentile with normal fluid.  She began leaking fluid today at 1:30 p.m., came to our office for evaluation and fern test was positive. She is admitted now for induction of labor.  PAST MEDICAL HISTORY:  Lupus diagnosed in 2010.  SURGICAL HISTORY:  Wisdom teeth extraction,  Bartholin gland surgery in 2005.  ALLERGIES:  No known drug allergies.  No latex allergy.  No food allergy.  FAMILY HISTORY:  Father with high blood pressure.  Paternal grandmother diabetes.  Paternal aunt with breast cancer.  Maternal aunt, hypertension and lupus.  Maternal grandmother with lupus.  Maternal uncle, high blood pressure.  SOCIAL HISTORY:  The patient was a former smoker.  She does not drink alcohol.  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  She was a Financial controller at [employer].  Married to a man named Airline pilot.  PHYSICAL EXAM:  Black female in no distress.  Weight is 179, blood pressure 130/80, pulse of 80.  Heart, normal size and sounds.  No murmurs.  Lungs are clear to auscultation.  Abdomen is soft.  Fundal height 36.5 cm.  Fetal heart tones normal.  Cervix is a fingertip dilated, 40% effaced, vertex at a -3.  Fluid is seen in the vagina that is fern positive.  ADMITTING IMPRESSION:  Intrauterine pregnancy at 38 weeks and 5 days, systemic lupus erythematosus, premature rupture of the membranes.  The patient is admitted for Pitocin induction.     Malachi Pro. Ambrose Mantle, M.D.     TFH/MEDQ  D:  05/11/2014  T:  05/11/2014  Job:  [id number]

## 2014-05-12 ENCOUNTER — Encounter (HOSPITAL_COMMUNITY): Payer: BC Managed Care – PPO | Admitting: Anesthesiology

## 2014-05-12 ENCOUNTER — Encounter (HOSPITAL_COMMUNITY): Payer: Self-pay | Admitting: *Deleted

## 2014-05-12 ENCOUNTER — Inpatient Hospital Stay (HOSPITAL_COMMUNITY): Payer: BC Managed Care – PPO | Admitting: Anesthesiology

## 2014-05-12 ENCOUNTER — Encounter (HOSPITAL_COMMUNITY)
Admission: AD | Disposition: A | Discharge status: Home / Self Care | Payer: Self-pay | Source: Ambulatory Visit | Attending: Obstetrics and Gynecology

## 2014-05-12 LAB — RPR

## 2014-05-12 SURGERY — Surgical Case
Anesthesia: Epidural | Site: Abdomen

## 2014-05-12 MED ORDER — LACTATED RINGERS IV SOLN
500.0000 mL | Freq: Once | INTRAVENOUS | Status: AC
  Administered 2014-05-12: 500 mL via INTRAVENOUS

## 2014-05-12 MED ORDER — PHENYLEPHRINE 8 MG IN D5W 100 ML (0.08MG/ML) PREMIX OPTIME
INJECTION | INTRAVENOUS | Status: DC | PRN
Start: 2014-05-12 — End: 2014-05-13
  Administered 2014-05-12: 40 ug/min via INTRAVENOUS

## 2014-05-12 MED ORDER — ONDANSETRON HCL 4 MG/2ML IJ SOLN
INTRAMUSCULAR | Status: DC | PRN
  Administered 2014-05-12: 4 mg via INTRAVENOUS

## 2014-05-12 MED ORDER — EPHEDRINE 5 MG/ML INJ: 10.0000 mg | INTRAVENOUS | Status: DC | PRN

## 2014-05-12 MED ORDER — 0.9 % SODIUM CHLORIDE (POUR BTL) OPTIME
TOPICAL | Status: DC | PRN
  Administered 2014-05-12: 1000 mL

## 2014-05-12 MED ORDER — MORPHINE SULFATE (PF) 0.5 MG/ML IJ SOLN
INTRAMUSCULAR | Status: DC | PRN
  Administered 2014-05-12: 4 mg via EPIDURAL

## 2014-05-12 MED ORDER — CEFAZOLIN SODIUM-DEXTROSE 2-3 GM-% IV SOLR
INTRAVENOUS | Status: DC | PRN
  Administered 2014-05-12: 2 g via INTRAVENOUS

## 2014-05-12 MED ORDER — DIPHENHYDRAMINE HCL 50 MG/ML IJ SOLN: 12.5000 mg | INTRAMUSCULAR | Status: DC | PRN

## 2014-05-12 MED ORDER — BUTORPHANOL TARTRATE 1 MG/ML IJ SOLN
1.0000 mg | Freq: Once | INTRAMUSCULAR | Status: AC
  Administered 2014-05-12: 1 mg via INTRAVENOUS
  Filled 2014-05-12: qty 1

## 2014-05-12 MED ORDER — MEPERIDINE HCL 25 MG/ML IJ SOLN
INTRAMUSCULAR | Status: DC | PRN
  Administered 2014-05-12 – 2014-05-13 (×2): 12.5 mg via INTRAVENOUS

## 2014-05-12 MED ORDER — PHENYLEPHRINE 40 MCG/ML (10ML) SYRINGE FOR IV PUSH (FOR BLOOD PRESSURE SUPPORT): 80.0000 ug | PREFILLED_SYRINGE | INTRAVENOUS | Status: DC | PRN

## 2014-05-12 MED ORDER — SODIUM BICARBONATE 8.4 % IV SOLN
INTRAVENOUS | Status: DC | PRN
  Administered 2014-05-12 (×2): 5 mL via EPIDURAL

## 2014-05-12 MED ORDER — PHENYLEPHRINE 40 MCG/ML (10ML) SYRINGE FOR IV PUSH (FOR BLOOD PRESSURE SUPPORT)
80.0000 ug | PREFILLED_SYRINGE | INTRAVENOUS | Status: DC | PRN
  Filled 2014-05-12: qty 10

## 2014-05-12 MED ORDER — FENTANYL 2.5 MCG/ML BUPIVACAINE 1/10 % EPIDURAL INFUSION (WH - ANES)
14.0000 mL/h | INTRAMUSCULAR | Status: DC | PRN
  Administered 2014-05-12 (×3): 14 mL/h via EPIDURAL
  Filled 2014-05-12 (×3): qty 125

## 2014-05-12 MED ORDER — MORPHINE SULFATE 0.5 MG/ML IJ SOLN
INTRAMUSCULAR | Status: AC
  Filled 2014-05-12: qty 10

## 2014-05-12 MED ORDER — LIDOCAINE HCL (PF) 1 % IJ SOLN
INTRAMUSCULAR | Status: DC | PRN
  Administered 2014-05-12 (×4): 4 mL

## 2014-05-12 MED ORDER — MEPERIDINE HCL 25 MG/ML IJ SOLN
INTRAMUSCULAR | Status: AC
  Filled 2014-05-12: qty 1

## 2014-05-12 MED ORDER — OXYTOCIN 10 UNIT/ML IJ SOLN
40.0000 [IU] | INTRAVENOUS | Status: DC | PRN
  Administered 2014-05-12: 40 [IU] via INTRAVENOUS

## 2014-05-12 MED ORDER — LACTATED RINGERS IV SOLN
INTRAVENOUS | Status: DC | PRN
  Administered 2014-05-12 – 2014-05-13 (×3): via INTRAVENOUS

## 2014-05-12 SURGICAL SUPPLY — 33 items
CLAMP CORD UMBIL (MISCELLANEOUS) ×2 IMPLANT
CLOTH BEACON ORANGE TIMEOUT ST (SAFETY) ×2 IMPLANT
CONTAINER PREFILL 10% NBF 15ML (MISCELLANEOUS) IMPLANT
DRAPE LG THREE QUARTER DISP (DRAPES) ×2 IMPLANT
DRSG OPSITE POSTOP 4X10 (GAUZE/BANDAGES/DRESSINGS) ×2 IMPLANT
DURAPREP 26ML APPLICATOR (WOUND CARE) ×2 IMPLANT
ELECT REM PT RETURN 9FT ADLT (ELECTROSURGICAL) ×2
ELECTRODE REM PT RTRN 9FT ADLT (ELECTROSURGICAL) ×1 IMPLANT
EXTRACTOR VACUUM KIWI (MISCELLANEOUS) IMPLANT
EXTRACTOR VACUUM M CUP 4 TUBE (SUCTIONS) IMPLANT
GLOVE BIO SURGEON STRL SZ8 (GLOVE) ×2 IMPLANT
GLOVE ORTHO TXT STRL SZ7.5 (GLOVE) ×2 IMPLANT
GOWN STRL REUS W/TWL LRG LVL3 (GOWN DISPOSABLE) ×4 IMPLANT
KIT ABG SYR 3ML LUER SLIP (SYRINGE) IMPLANT
NEEDLE HYPO 25X5/8 SAFETYGLIDE (NEEDLE) ×2 IMPLANT
NS IRRIG 1000ML POUR BTL (IV SOLUTION) ×2 IMPLANT
PACK C SECTION WH (CUSTOM PROCEDURE TRAY) ×2 IMPLANT
PAD OB MATERNITY 4.3X12.25 (PERSONAL CARE ITEMS) ×2 IMPLANT
RTRCTR C-SECT PINK 25CM LRG (MISCELLANEOUS) ×2 IMPLANT
STAPLER VISISTAT 35W (STAPLE) ×2 IMPLANT
SUT CHROMIC 1 CTX 36 (SUTURE) ×4 IMPLANT
SUT PLAIN 0 NONE (SUTURE) IMPLANT
SUT PLAIN 2 0 (SUTURE) ×1
SUT PLAIN 2 0 XLH (SUTURE) IMPLANT
SUT PLAIN ABS 2-0 CT1 27XMFL (SUTURE) ×1 IMPLANT
SUT VIC AB 0 CT1 27 (SUTURE) ×2
SUT VIC AB 0 CT1 27XBRD ANBCTR (SUTURE) ×2 IMPLANT
SUT VIC AB 2-0 CT1 27 (SUTURE) ×1
SUT VIC AB 2-0 CT1 TAPERPNT 27 (SUTURE) ×1 IMPLANT
SUT VIC AB 4-0 KS 27 (SUTURE) IMPLANT
TOWEL OR 17X24 6PK STRL BLUE (TOWEL DISPOSABLE) ×2 IMPLANT
TRAY FOLEY CATH 14FR (SET/KITS/TRAYS/PACK) ×2 IMPLANT
WATER STERILE IRR 1000ML POUR (IV SOLUTION) ×2 IMPLANT

## 2014-05-12 NOTE — Progress Notes (Signed)
Comfortable, pushing for almost an hour Afeb, VSS FHT- Cat II, variable decels with ctx VE-C/C/+1, LOP, attempted to rotate to LOA Continue pushing and monitor FHT

## 2014-05-12 NOTE — Progress Notes (Signed)
Comfortable with epidural Afeb, VSS FHT- Cat II, occ variable decel VE-3/70/-1, vtx, IUPC placed Will continue pitocin, monitor progress

## 2014-05-12 NOTE — Progress Notes (Signed)
Comfortable, pushing for about 2 hrs Temp 100.0, VSS FHT- Cat II, still some variables with ctx VE-C/C/+2, vtx, still LOP Discussed options, agreed to try vacuum assisted delivery Foley removed, good epidural.  Mushroom cup applied, with 2 ctx no progress made so vacuum removed Discussed situation again and recommended c-section.  Discussed c-section procedure and risks.  Will proceed when OR is ready.

## 2014-05-12 NOTE — Anesthesia Preprocedure Evaluation (Addendum)
Anesthesia Evaluation  Patient identified by MRN, date of birth, ID band Patient awake    Reviewed: Allergy & Precautions, H&P , NPO status , Patient's Chart, lab work & pertinent test results, reviewed documented beta blocker date and time   History of Anesthesia Complications Negative for: history of anesthetic complications  Airway Mallampati: III TM Distance: >3 FB Neck ROM: full    Dental  (+) Teeth Intact   Pulmonary neg pulmonary ROS,  breath sounds clear to auscultation        Cardiovascular negative cardio ROS  Rhythm:regular Rate:Normal     Neuro/Psych negative neurological ROS  negative psych ROS   GI/Hepatic negative GI ROS, Neg liver ROS,   Endo/Other  negative endocrine ROS  Renal/GU negative Renal ROS     Musculoskeletal   Abdominal   Peds  Hematology negative hematology ROS (+)   Anesthesia Other Findings   Reproductive/Obstetrics (+) Pregnancy (failure to descend --> C/S)                         Anesthesia Physical Anesthesia Plan  ASA: II and emergent  Anesthesia Plan: Epidural   Post-op Pain Management:    Induction:   Airway Management Planned:   Additional Equipment:   Intra-op Plan:   Post-operative Plan:   Informed Consent: I have reviewed the patients History and Physical, chart, labs and discussed the procedure including the risks, benefits and alternatives for the proposed anesthesia with the patient or authorized representative who has indicated his/her understanding and acceptance.     Plan Discussed with: Surgeon and CRNA  Anesthesia Plan Comments:        Anesthesia Quick Evaluation

## 2014-05-12 NOTE — Anesthesia Procedure Notes (Signed)
Epidural Patient location during procedure: OB Start time: 05/12/2014 5:37 AM  Staffing Performed by: anesthesiologist   Preanesthetic Checklist Completed: patient identified, site marked, surgical consent, pre-op evaluation, timeout performed, IV checked, risks and benefits discussed and monitors and equipment checked  Epidural Patient position: sitting Prep: site prepped and draped and DuraPrep Patient monitoring: continuous pulse ox and blood pressure Approach: midline Injection technique: LOR air  Needle:  Needle type: Tuohy  Needle gauge: 17 G Needle length: 9 cm and 9 Needle insertion depth: 6.5 cm Catheter type: closed end flexible Catheter size: 19 Gauge Catheter at skin depth: 11.5 cm Test dose: negative  Assessment Events: blood not aspirated, injection not painful, no injection resistance, negative IV test and no paresthesia  Additional Notes Discussed risk of headache, infection, bleeding, nerve injury and failed or incomplete block.  Patient voices understanding and wishes to proceed.  Epidural placed easily on first attempt.  No paresthesia.  Patient tolerated procedure well with no apparent complications.  Jasmine December, MDReason for block:procedure for pain

## 2014-05-12 NOTE — Progress Notes (Signed)
Patient ID: Sheila Bates, female   DOB: July 05, 1986, 28 y.o.   MRN: [id number] Pitocin at 25 mu/minute and her contractions are painful but are difficult to trace. Cervix is 2 cm 60 % effaced and the vertex is at - 2 station. She requests an epidural.

## 2014-05-12 NOTE — Progress Notes (Signed)
Comfortable Afeb, VSS FHT- Cat II, occ variable decel, mod variability, + scalp stim VE-9/C/+1 Continue pitocin, monitor progress and FHT

## 2014-05-12 NOTE — Progress Notes (Signed)
Comfortable Afeb, VSS FHT- Cat I currently, has had some variables VE-6/80/0, vtx Continue pitocin, monitor progress

## 2014-05-13 ENCOUNTER — Encounter (HOSPITAL_COMMUNITY): Payer: Self-pay | Admitting: *Deleted

## 2014-05-13 LAB — CBC
HCT: 29.3 % — ABNORMAL LOW (ref 36.0–46.0)
Hemoglobin: 9.6 g/dL — ABNORMAL LOW (ref 12.0–15.0)
MCH: 29.2 pg (ref 26.0–34.0)
MCHC: 32.8 g/dL (ref 30.0–36.0)
MCV: 89.1 fL (ref 78.0–100.0)
PLATELETS: 167 10*3/uL (ref 150–400)
RBC: 3.29 MIL/uL — ABNORMAL LOW (ref 3.87–5.11)
RDW: 12.6 % (ref 11.5–15.5)
WBC: 14.3 10*3/uL — AB (ref 4.0–10.5)

## 2014-05-13 LAB — ABO/RH: ABO/RH(D): O POS

## 2014-05-13 MED ORDER — PRENATAL MULTIVITAMIN CH
1.0000 | ORAL_TABLET | Freq: Every day | ORAL | Status: DC
  Administered 2014-05-13 – 2014-05-15 (×3): 1 via ORAL
  Filled 2014-05-13 (×3): qty 1

## 2014-05-13 MED ORDER — KETOROLAC TROMETHAMINE 60 MG/2ML IM SOLN
INTRAMUSCULAR | Status: AC
  Administered 2014-05-13: 60 mg via INTRAMUSCULAR
  Filled 2014-05-13: qty 2

## 2014-05-13 MED ORDER — MEPERIDINE HCL 25 MG/ML IJ SOLN: 6.2500 mg | INTRAMUSCULAR | Status: DC | PRN

## 2014-05-13 MED ORDER — NALOXONE HCL 0.4 MG/ML IJ SOLN: 0.4000 mg | INTRAMUSCULAR | Status: DC | PRN

## 2014-05-13 MED ORDER — METOCLOPRAMIDE HCL 5 MG/ML IJ SOLN: 10.0000 mg | Freq: Three times a day (TID) | INTRAMUSCULAR | Status: DC | PRN

## 2014-05-13 MED ORDER — SIMETHICONE 80 MG PO CHEW: 80.0000 mg | CHEWABLE_TABLET | ORAL | Status: DC | PRN

## 2014-05-13 MED ORDER — SODIUM CHLORIDE 0.9 % IJ SOLN: 3.0000 mL | INTRAMUSCULAR | Status: DC | PRN

## 2014-05-13 MED ORDER — DIPHENHYDRAMINE HCL 25 MG PO CAPS: 25.0000 mg | ORAL_CAPSULE | Freq: Four times a day (QID) | ORAL | Status: DC | PRN

## 2014-05-13 MED ORDER — HYDROXYCHLOROQUINE SULFATE 200 MG PO TABS
400.0000 mg | ORAL_TABLET | Freq: Every day | ORAL | Status: DC
  Administered 2014-05-13 – 2014-05-15 (×3): 400 mg via ORAL
  Filled 2014-05-13 (×3): qty 2

## 2014-05-13 MED ORDER — PHENYLEPHRINE 40 MCG/ML (10ML) SYRINGE FOR IV PUSH (FOR BLOOD PRESSURE SUPPORT)
PREFILLED_SYRINGE | INTRAVENOUS | Status: AC
  Filled 2014-05-13: qty 5

## 2014-05-13 MED ORDER — NALBUPHINE HCL 10 MG/ML IJ SOLN: 5.0000 mg | INTRAMUSCULAR | Status: DC | PRN

## 2014-05-13 MED ORDER — ONDANSETRON HCL 4 MG/2ML IJ SOLN
INTRAMUSCULAR | Status: AC
  Filled 2014-05-13: qty 2

## 2014-05-13 MED ORDER — ONDANSETRON HCL 4 MG/2ML IJ SOLN: 4.0000 mg | Freq: Three times a day (TID) | INTRAMUSCULAR | Status: DC | PRN

## 2014-05-13 MED ORDER — SIMETHICONE 80 MG PO CHEW
80.0000 mg | CHEWABLE_TABLET | ORAL | Status: DC
  Administered 2014-05-13 – 2014-05-14 (×2): 80 mg via ORAL
  Filled 2014-05-13 (×2): qty 1

## 2014-05-13 MED ORDER — SCOPOLAMINE 1 MG/3DAYS TD PT72
MEDICATED_PATCH | TRANSDERMAL | Status: AC
  Filled 2014-05-13: qty 1

## 2014-05-13 MED ORDER — SCOPOLAMINE 1 MG/3DAYS TD PT72
1.0000 | MEDICATED_PATCH | Freq: Once | TRANSDERMAL | Status: DC
  Administered 2014-05-13: 1.5 mg via TRANSDERMAL

## 2014-05-13 MED ORDER — NALBUPHINE HCL 10 MG/ML IJ SOLN
5.0000 mg | INTRAMUSCULAR | Status: DC | PRN
  Administered 2014-05-13: 10 mg via INTRAVENOUS
  Filled 2014-05-13: qty 1

## 2014-05-13 MED ORDER — FENTANYL CITRATE 0.05 MG/ML IJ SOLN
INTRAMUSCULAR | Status: AC
  Administered 2014-05-13: 50 ug via INTRAVENOUS
  Filled 2014-05-13: qty 2

## 2014-05-13 MED ORDER — FENTANYL CITRATE 0.05 MG/ML IJ SOLN
25.0000 ug | INTRAMUSCULAR | Status: DC | PRN
  Administered 2014-05-13 (×2): 50 ug via INTRAVENOUS

## 2014-05-13 MED ORDER — MORPHINE SULFATE (PF) 0.5 MG/ML IJ SOLN
INTRAMUSCULAR | Status: DC | PRN
  Administered 2014-05-13 (×2): .5 mg via EPIDURAL

## 2014-05-13 MED ORDER — DIPHENHYDRAMINE HCL 50 MG/ML IJ SOLN: 25.0000 mg | INTRAMUSCULAR | Status: DC | PRN

## 2014-05-13 MED ORDER — KETOROLAC TROMETHAMINE 60 MG/2ML IM SOLN
60.0000 mg | Freq: Once | INTRAMUSCULAR | Status: AC | PRN
  Administered 2014-05-13: 60 mg via INTRAMUSCULAR

## 2014-05-13 MED ORDER — KETOROLAC TROMETHAMINE 30 MG/ML IJ SOLN: 30.0000 mg | Freq: Four times a day (QID) | INTRAMUSCULAR | Status: AC | PRN

## 2014-05-13 MED ORDER — ASPIRIN 81 MG PO CHEW
81.0000 mg | CHEWABLE_TABLET | Freq: Every day | ORAL | Status: DC
  Administered 2014-05-13 – 2014-05-15 (×3): 81 mg via ORAL
  Filled 2014-05-13 (×3): qty 1

## 2014-05-13 MED ORDER — WITCH HAZEL-GLYCERIN EX PADS: 1.0000 "application " | MEDICATED_PAD | CUTANEOUS | Status: DC | PRN

## 2014-05-13 MED ORDER — IBUPROFEN 600 MG PO TABS
600.0000 mg | ORAL_TABLET | Freq: Four times a day (QID) | ORAL | Status: DC
  Administered 2014-05-13 – 2014-05-15 (×9): 600 mg via ORAL
  Filled 2014-05-13 (×9): qty 1

## 2014-05-13 MED ORDER — ONDANSETRON HCL 4 MG/2ML IJ SOLN: 4.0000 mg | INTRAMUSCULAR | Status: DC | PRN

## 2014-05-13 MED ORDER — TETANUS-DIPHTH-ACELL PERTUSSIS 5-2.5-18.5 LF-MCG/0.5 IM SUSP: 0.5000 mL | Freq: Once | INTRAMUSCULAR | Status: DC

## 2014-05-13 MED ORDER — ZOLPIDEM TARTRATE 5 MG PO TABS
5.0000 mg | ORAL_TABLET | Freq: Every evening | ORAL | Status: DC | PRN
Start: 2014-05-13 — End: 2014-05-15

## 2014-05-13 MED ORDER — LACTATED RINGERS IV SOLN
INTRAVENOUS | Status: DC
  Administered 2014-05-13: 11:00:00 via INTRAVENOUS

## 2014-05-13 MED ORDER — MAGNESIUM HYDROXIDE 400 MG/5ML PO SUSP
30.0000 mL | ORAL | Status: DC | PRN
Start: 2014-05-13 — End: 2014-05-15

## 2014-05-13 MED ORDER — ONDANSETRON HCL 4 MG PO TABS: 4.0000 mg | ORAL_TABLET | ORAL | Status: DC | PRN

## 2014-05-13 MED ORDER — OXYTOCIN 40 UNITS IN LACTATED RINGERS INFUSION - SIMPLE MED: 62.5000 mL/h | INTRAVENOUS | Status: AC

## 2014-05-13 MED ORDER — OXYCODONE-ACETAMINOPHEN 5-325 MG PO TABS
1.0000 | ORAL_TABLET | ORAL | Status: DC | PRN
  Administered 2014-05-13 – 2014-05-14 (×4): 1 via ORAL
  Administered 2014-05-14 (×2): 2 via ORAL
  Administered 2014-05-14: 1 via ORAL
  Administered 2014-05-14: 2 via ORAL
  Administered 2014-05-15 (×3): 1 via ORAL
  Filled 2014-05-13 (×7): qty 1
  Filled 2014-05-13: qty 2
  Filled 2014-05-13: qty 1
  Filled 2014-05-13: qty 2
  Filled 2014-05-13: qty 1

## 2014-05-13 MED ORDER — LANOLIN HYDROUS EX OINT: 1.0000 "application " | TOPICAL_OINTMENT | CUTANEOUS | Status: DC | PRN

## 2014-05-13 MED ORDER — MENTHOL 3 MG MT LOZG: 1.0000 | LOZENGE | OROMUCOSAL | Status: DC | PRN

## 2014-05-13 MED ORDER — SENNOSIDES-DOCUSATE SODIUM 8.6-50 MG PO TABS
2.0000 | ORAL_TABLET | ORAL | Status: DC
  Administered 2014-05-13 – 2014-05-14 (×2): 2 via ORAL
  Filled 2014-05-13 (×2): qty 2

## 2014-05-13 MED ORDER — SIMETHICONE 80 MG PO CHEW
80.0000 mg | CHEWABLE_TABLET | Freq: Three times a day (TID) | ORAL | Status: DC
Start: 2014-05-13 — End: 2014-05-15
  Administered 2014-05-13 – 2014-05-15 (×7): 80 mg via ORAL
  Filled 2014-05-13 (×7): qty 1

## 2014-05-13 MED ORDER — PHENYLEPHRINE 8 MG IN D5W 100 ML (0.08MG/ML) PREMIX OPTIME
INJECTION | INTRAVENOUS | Status: AC
Start: 2014-05-13 — End: 2014-05-13
  Filled 2014-05-13: qty 100

## 2014-05-13 MED ORDER — DIBUCAINE 1 % RE OINT: 1.0000 "application " | TOPICAL_OINTMENT | RECTAL | Status: DC | PRN

## 2014-05-13 MED ORDER — NALOXONE HCL 1 MG/ML IJ SOLN
1.0000 ug/kg/h | INTRAVENOUS | Status: DC | PRN
  Filled 2014-05-13: qty 2

## 2014-05-13 MED ORDER — DIPHENHYDRAMINE HCL 25 MG PO CAPS
25.0000 mg | ORAL_CAPSULE | ORAL | Status: DC | PRN
  Administered 2014-05-13: 25 mg via ORAL
  Filled 2014-05-13: qty 1

## 2014-05-13 MED ORDER — OXYTOCIN 10 UNIT/ML IJ SOLN
INTRAMUSCULAR | Status: AC
  Filled 2014-05-13: qty 4

## 2014-05-13 MED ORDER — METOCLOPRAMIDE HCL 5 MG/ML IJ SOLN: 10.0000 mg | Freq: Once | INTRAMUSCULAR | Status: DC | PRN

## 2014-05-13 MED ORDER — DIPHENHYDRAMINE HCL 50 MG/ML IJ SOLN: 12.5000 mg | INTRAMUSCULAR | Status: DC | PRN

## 2014-05-13 MED ORDER — MEASLES, MUMPS & RUBELLA VAC ~~LOC~~ INJ
0.5000 mL | INJECTION | Freq: Once | SUBCUTANEOUS | Status: DC
  Filled 2014-05-13: qty 0.5

## 2014-05-13 NOTE — Progress Notes (Signed)
Subjective: Postpartum Day #1: Cesarean Delivery Patient reports incisional pain and tolerating PO.    Objective: Vital signs in last 24 hours: Temp:  [97.6 F (36.4 C)-100 F (37.8 C)] 98.8 F (37.1 C) (07/10 0745) Pulse Rate:  [80-125] 103 (07/10 0745) Resp:  [10-22] 18 (07/10 0745) BP: (93-138)/(50-87) 108/68 mmHg (07/10 0745) SpO2:  [96 %-100 %] 97 % (07/10 0745)  Physical Exam:  General: alert Lochia: appropriate Uterine Fundus: firm Incision: dressing C/D/I   Recent Labs  05/11/14 1800 05/13/14 0555  HGB 12.0 9.6*  HCT 35.8* 29.3*    Assessment/Plan: Status post Cesarean section. Doing well postoperatively.  Continue current care, ambulate, continue solu-cortef for stress dose steroids, monitor for fever.  Keonta Monceaux D 05/13/2014, 8:38 AM

## 2014-05-13 NOTE — Anesthesia Postprocedure Evaluation (Signed)
  Anesthesia Post-op Note  Anesthesia Post Note  Patient: Sheila Bates  Procedure(s) Performed: Procedure(s) (LRB): Primary Cesarean Section Delivery Baby Girl @ 2353, Apgars (N/A)  Anesthesia type: Epidural  Patient location: PACU  Post pain: Pain level controlled  Post assessment: Post-op Vital signs reviewed  Post vital signs: stable  Level of consciousness: awake  Complications: No apparent anesthesia complications

## 2014-05-13 NOTE — Anesthesia Postprocedure Evaluation (Signed)
Anesthesia Post Note  Patient: Sheila Bates  Procedure(s) Performed: Procedure(s) (LRB): Primary Cesarean Section Delivery Baby Girl @ 2353, Apgars (N/A)  Anesthesia type: Epidural  Patient location: Mother/Baby  Post pain: Pain level controlled  Post assessment: Post-op Vital signs reviewed  Last Vitals:  Filed Vitals:   05/13/14 0545  BP: 112/71  Pulse: 87  Temp: 36.6 C  Resp: 20    Post vital signs: Reviewed  Level of consciousness:alert  Complications: No apparent anesthesia complications

## 2014-05-13 NOTE — Op Note (Signed)
Preoperative diagnosis: Intrauterine pregnancy at 38 weeks, arrest of descent Postoperative diagnosis: Same Procedure: Primary low transverse cesarean section without extensions Surgeon: Lavina Hamman M.D. Anesthesia: Epidural Findings: Patient had normal gravid anatomy and delivered a viable female infant with Apgars of 8 and 9 weight pending, direct OP position Estimated blood loss: 800 cc Specimens: Placenta sent for routine pathology Complications: None  Procedure in detail: The patient was taken to the operating room and placed in the dorsosupine position with left tilt. Her previously placed epidural was dosed appropriately.  Abdomen was then prepped and draped in the usual sterile fashion, a foley catheter had previously been inserted. The level of her anesthesia was found to be adequate. Abdomen was entered via a standard Pfannenstiel incision. Once the peritoneal cavity was entered the Alexis disposable self-retaining retractor was placed and good visualization was achieved. A 4 cm transverse incision was then made in the lower uterine segment pushing the bladder inferior. Once the uterine cavity was entered the incision was extended digitally. The fetal vertex was grasped and delivered through the incision atraumatically. Mouth and nares were suctioned. The remainder of the infant then delivered atraumatically. Cord was doubly clamped and cut and the infant handed to the awaiting pediatric team. Cord blood was obtained. The placenta delivered spontaneously. Uterus was wiped dry with clean lap pad and all clots and debris were removed. Uterine incision was inspected and found to be free of extensions. Uterine incision was closed in 2 layers with running locking #1 Chromic for the first layer, a second layer was an imbricating layer also with #1 Chromic.  Bleeding from the right angle was controlled with 2 figure 8 sutures of #1 Chromic. Tubes and ovaries were inspected and found to be normal.  Uterine incision was inspected and found to be hemostatic. Bleeding from serosal edges was controlled with electrocautery. The Alexis retractor was removed. Subfascial space was irrigated and made hemostatic with electrocautery. Peritoneum was closed with running 2-0 Vicryl.  Fascia was closed in running fashion starting at both ends and meeting in the middle with 0 Vicryl. Subcutaneous tissue was then irrigated and made hemostatic with electrocautery, then closed with running 2-0 plain gut. Skin was closed with staples followed by a sterile dressing. Patient tolerated the procedure well and was taken to the recovery in stable condition. Counts were correct x2, she received Ancef 2 g IV at the beginning of the procedure and she had PAS hose on throughout the procedure.

## 2014-05-13 NOTE — Addendum Note (Signed)
Addendum created 05/13/14 0802 by Lincoln Brigham, CRNA   Modules edited: Notes Section   Notes Section:  File: [id number]

## 2014-05-13 NOTE — Lactation Note (Signed)
This note was copied from the chart of Sheila Tacoya Altizer. Lactation Consultation Note  Mother's flat nipples invert when compressed.  Able to evert some with hand pump and colostrum expressed. Unable to latch baby in both fb and cross cradle. Introduced #20 and #24NS.  Applied colostrum to outside of NS.  Demonstrated reverse pressure. Baby latched off and on with NS, baby's mouth fits better on #20 but mother anatomy fits better #24. Some sucks observed with stimulation. Mother had an difficult time in both positions and will need reinforcement. Reviewed basics, breast compression Mom made aware of O/P services, breastfeeding support groups, community resources, and our phone # for post-discharge questions.  Mom encouraged to feed baby 8-12 times/24 hours and with feeding cues.  Encouraged her to call for assistance for later feeding. Provided mother with shells to wear to evert nipples.  Patient Name: Sheila Bates WCBJS'E Date: 05/13/2014 Reason for consult: Initial assessment   Maternal Data Has patient been taught Hand Expression?: Yes Does the patient have breastfeeding experience prior to this delivery?: No  Feeding Feeding Type: Breast Fed Length of feed: 10 min  LATCH Score/Interventions Latch: Repeated attempts needed to sustain latch, nipple held in mouth throughout feeding, stimulation needed to elicit sucking reflex. Intervention(s): Waking techniques;Skin to skin Intervention(s): Adjust position;Assist with latch;Breast massage;Breast compression  Audible Swallowing: A few with stimulation Intervention(s): Hand expression Intervention(s): Skin to skin  Type of Nipple: Flat (invert with compression) Intervention(s): Shells;Hand pump;Reverse pressure        Hold (Positioning): Assistance needed to correctly position infant at breast and maintain latch. Intervention(s): Breastfeeding basics reviewed;Support Pillows;Position options;Skin to skin      Lactation Tools Discussed/Used Tools: Pump;Nipple Dorris Carnes;Shells Nipple shield size: 20;24 Shell Type: Other (comment)   Consult Status Consult Status: Follow-up Date: 05/14/14 Follow-up type: In-patient    Dahlia Byes [facility] 05/13/2014, 3:39 PM

## 2014-05-13 NOTE — Lactation Note (Signed)
This note was copied from the chart of Girl Shelisa Fern. Lactation Consultation Note  Patient Name: Girl Dakotah Heiman KBTCY'E Date: 05/13/2014 Reason for consult: Follow-up assessment Baby 23 hours of life. Follow-up to check on comfort with using NS and to see if baby nursing better. Mom reports baby nursing with NS well. Enc mom to offer lots of STS, feed with cues and at least 8-12 times/24 hours. Enc mom to hand express after nursing and give baby whatever she is able to express. Mom states she is more confident with the NS now. Mom states she can feel baby sucking better. Enc mom to call out for assistance with latching as needed.   Maternal Data    Feeding Feeding Type: Breast Fed  LATCH Score/Interventions Latch: Grasps breast easily, tongue down, lips flanged, rhythmical sucking.  Audible Swallowing: A few with stimulation  Type of Nipple: Flat  Comfort (Breast/Nipple): Soft / non-tender     Hold (Positioning): Assistance needed to correctly position infant at breast and maintain latch.  LATCH Score: 7  Lactation Tools Discussed/Used     Consult Status Consult Status: Follow-up Date: 05/14/14 Follow-up type: In-patient    Geralynn Ochs 05/13/2014, 11:06 PM

## 2014-05-13 NOTE — Transfer of Care (Signed)
Immediate Anesthesia Transfer of Care Note  Patient: Sheila Bates  Procedure(s) Performed: Procedure(s): Primary Cesarean Section Delivery Baby Girl @ 2353, Apgars (N/A)  Patient Location: PACU  Anesthesia Type:Epidural  Level of Consciousness: awake, alert  and oriented  Airway & Oxygen Therapy: Patient Spontanous Breathing  Post-op Assessment: Report given to PACU RN and Post -op Vital signs reviewed and stable  Post vital signs: Reviewed and stable  Complications: No apparent anesthesia complications

## 2014-05-14 NOTE — Progress Notes (Signed)
Subjective: Postpartum Day 2 Cesarean Delivery Patient reports incisional pain, tolerating PO and no problems voiding.    Objective: Vital signs in last 24 hours: Temp:  [98.1 F (36.7 C)-98.8 F (37.1 C)] 98.3 F (36.8 C) (07/11 0600) Pulse Rate:  [91-120] 96 (07/11 0600) Resp:  [18-20] 18 (07/11 0600) BP: (101-112)/(67-73) 108/71 mmHg (07/11 0600) SpO2:  [95 %-99 %] 99 % (07/10 2050)  Physical Exam:  General: alert and cooperative Lochia: appropriate Uterine Fundus: firm Incision: C/D/I    Recent Labs  05/11/14 1800 05/13/14 0555  HGB 12.0 9.6*  HCT 35.8* 29.3*    Assessment/Plan: Status post Cesarean section. Doing well postoperatively.  Continue current care.  Sheila Bates 05/14/2014, 7:42 AM

## 2014-05-14 NOTE — Lactation Note (Signed)
This note was copied from the chart of Sheila Matika Bartell. Lactation Consultation Note Not being d/c home today. Mom still has SL in place. Will plan to d/c tomorrow if things go well. Mom is using NS. Has tried feeding w/NS to Lt. Breast yet. Stating starting to get a little sore. Mom had just got breakfast, encouraged her to call for next feeding so i could watch latch and assess for soreness. Patient Name: Sheila Bates WNUUV'O Date: 05/14/2014     Maternal Data    Feeding Feeding Type: Breast Fed Length of feed: 10 min  LATCH Score/Interventions                      Lactation Tools Discussed/Used     Consult Status      Ellary Casamento G 05/14/2014, 8:50 AM

## 2014-05-14 NOTE — Discharge Summary (Signed)
Obstetric Discharge Summary Reason for Admission: rupture of membranes Prenatal Procedures: NST, ultrasound and fetal echo Intrapartum Procedures: cesarean: low cervical, transverse Postpartum Procedures: none Complications-Operative and Postpartum: none Hemoglobin  Date Value Ref Range Status  05/13/2014 9.6* 12.0 - 15.0 g/dL Final     DELTA CHECK NOTED     REPEATED TO VERIFY  01/05/2014 11.7* 12.2 - 16.2 g/dL Final     HCT  Date Value Ref Range Status  05/13/2014 29.3* 36.0 - 46.0 % Final     HCT, POC  Date Value Ref Range Status  01/05/2014 37.8  37.7 - 47.9 % Final    Physical Exam:  General: alert and cooperative Lochia: appropriate Uterine Fundus: firm Incision: C/D/I   Discharge Diagnoses: Term Pregnancy-delivered                                         Maternal Systemic Lupus Discharge Information: Date: 05/15/2014 Activity: pelvic rest Diet: routine Medications: Ibuprofen and Percocet Condition: improved Instructions: refer to practice specific booklet Discharge to: home Follow-up Information   Follow up with MEISINGER,TODD D, MD. Schedule an appointment as soon as possible for a visit in 2 weeks. (Incision check)    Specialty:  Obstetrics and Gynecology   Contact information:   [address] [place] [address] [phone]       Follow up with Oliver Pila, MD. Schedule an appointment as soon as possible for a visit in 2 weeks. (incision check)    Specialty:  Obstetrics and Gynecology   Contact information:   [address] [place] [address] [phone]       Newborn Data: Live born female  Birth Weight: 6 lb 5.2 oz (2870 g) APGAR: 8, 9  Home with mother.  Oliver Pila 05/15/2014, 9:55 AM

## 2014-05-15 LAB — GLUCOSE, CAPILLARY: Glucose-Capillary: 56 mg/dL — ABNORMAL LOW (ref 70–99)

## 2014-05-15 MED ORDER — OXYCODONE-ACETAMINOPHEN 5-325 MG PO TABS: 1.0000 | ORAL_TABLET | ORAL | Status: DC | PRN

## 2014-05-15 MED ORDER — IBUPROFEN 600 MG PO TABS
600.0000 mg | ORAL_TABLET | Freq: Four times a day (QID) | ORAL | Status: DC
Start: 2014-05-15 — End: 2014-11-30

## 2014-05-15 NOTE — Progress Notes (Signed)
Subjective: Postpartum Day 3 Cesarean Delivery Patient reports tolerating PO and no problems voiding.  Passing small amount of flatus  Objective: Vital signs in last 24 hours: Temp:  [98.1 F (36.7 C)-98.8 F (37.1 C)] 98.8 F (37.1 C) (07/12 0535) Pulse Rate:  [94] 94 (07/12 0535) Resp:  [18-20] 20 (07/12 0535) BP: (132)/(85) 132/85 mmHg (07/12 0535)  Physical Exam:  General: alert and cooperative Lochia: appropriate Uterine Fundus: firm Incision: C/D/I    Recent Labs  05/13/14 0555  HGB 9.6*  HCT 29.3*    Assessment/Plan: Status post Cesarean section. Doing well postoperatively.  Discharge home with standard precautions and return to clinic in 2 weeks.  Sheila Bates 05/15/2014, 9:53 AM

## 2014-05-15 NOTE — Lactation Note (Signed)
This note was copied from the chart of Sheila Bates. Lactation Consultation Note  Family being discharged.  Infant recently breastfed for 15 min.  Mother states she is seeing breastmilk in #20NS. Mother nipples invert when compressed.   Rented DEBP until personal pump comes arrives.  Encouraged mother to hand express then post pump for 15-20 min 4-6 times a day and give baby back what is pumped. Reviewed how to use foley cup, pump cleaning and milk storage. Provided mother with #30 flanges and #20 & #24 NS.   Outpatient appointment 7/16 1pm.  Patient Name: Sheila Lidiya Reise [id number] Date: 05/15/2014 Reason for consult: Follow-up assessment   Maternal Data    Feeding Feeding Type: Breast Fed Length of feed: 15 min  LATCH Score/Interventions                      Lactation Tools Discussed/Used Nipple shield size: 20 Shell Type: Inverted Pump Review: Setup, frequency, and cleaning;Milk Storage Date initiated:: 05/08/14   Consult Status Consult Status: Follow-up Date: 05/19/14 Follow-up type: Out-patient    Dahlia Byes [facility] 05/15/2014, 12:14 PM

## 2014-05-16 ENCOUNTER — Encounter (HOSPITAL_COMMUNITY): Payer: Self-pay | Admitting: Obstetrics and Gynecology

## 2014-05-17 ENCOUNTER — Ambulatory Visit (HOSPITAL_COMMUNITY)
Admission: RE | Admit: 2014-05-17 | Discharge: 2014-05-17 | Disposition: A | Discharge status: Home / Self Care | Payer: BC Managed Care – PPO | Source: Ambulatory Visit | Attending: Obstetrics and Gynecology | Admitting: Obstetrics and Gynecology

## 2014-05-17 NOTE — Lactation Note (Signed)
Lactation Consult  Mother's reason for visit:  Breastfeeding assist Visit Type:  FEEDING ASSESSMENT Appointment Notes:  USING NIPPLE SHIELD Consult:  Initial Lactation Consultant:  Hansel Feinstein  ________________________________________________________________________   Sheila Bates Name: Sheila Bates  Date of Birth: 05/12/2016  Pediatrician:CELESTE WALLACE Gender: female  Gestational Age: [redacted]w[redacted]d (At Birth)  Birth Weight: 6 lb 5.2 oz (2870 g)  Weight at Discharge: Weight: 5 lb 15.1 oz (2695 g) Date of Discharge: 05/15/2014  Filed Weights   05/12/14 2353 05/13/14 2355 05/15/14 0015  Weight: 6 lb 5.2 oz (2870 g) 6 lb 1.5 oz (2765 g) 5 lb 15.1 oz (2695 g)  Last weight taken from location outside of [facility]: 5-15 on 03/16/14 Location:Pediatrician's office  Weight today: 5-14.4  ________________________________________________________________________  Mother's Name: Loraine Leriche Type of delivery:  C/S Breastfeeding Experience:  NONE Maternal Medical Conditions:  LUPUS Maternal Medications:  PLAQUINIL, BABY ASPIRIN,PNV'S, PERCOCET, IBUPROFEN  ________________________________________________________________________  Breastfeeding History (Post Discharge)  Frequency of breastfeeding:  EVERY 1-2 HOURS Duration of feeding:  15-35 MINUTES    Pumping  Type of pump:  Symphony Frequency:  3 TIMES PER DAY Volume:  35-20ml AFTER FEEDING  Infant Intake and Output Assessment  Voids:  5-6 in 24 hrs.  Color:  Clear yellow Stools:  4 BROWN in 24 hrs.  Color:  Brown  ________________________________________________________________________  Maternal Breast Assessment  Breast:  Full Nipple:  Flat Pain level:  3 LEFT SIDE ONLY Pain interventions:  Comfort gels  _______________________________________________________________________ Feeding Assessment/Evaluation  Initial feeding assessment:  Infant's oral assessment:  WNL  Positioning:  Cross cradle Right breast AND  LEFT BREAST  LATCH documentation:  Latch:  2 = Grasps breast easily, tongue down, lips flanged, rhythmical sucking.  Audible swallowing:  2 = Spontaneous and intermittent  Type of nipple:  1 = Flat  Comfort (Breast/Nipple):  2 = Soft / non-tender  Hold (Positioning):  1 = Assistance needed to correctly position infant at breast and maintain latch  LATCH score:  8  Attached assessment:  Deep  Lips flanged:  Yes.    Lips untucked:  No.  Suck assessment:  Nutritive  Tools:  Nipple shield 24 mm, Pump and Bottle Instructed on use and cleaning of tool:  Yes.    Pre-feed weight:  2676 g   Post-feed weight:  2716 g  Amount transferred:  40 ml Amount supplemented:  0 ml  THIS 5 DAY OLD INFANT HERE WITH PARENTS FOR A FEEDING.ASSESSMENT.  BABY WAS STARTED ON A 20 MM NIPPLE SHIELD IN HOSPITAL.  MOM STATES HER MILK CAME IN YESTERDAY AND BREASTS ARE FULL.  MOM HAS SOME PAIN ON LEFT NIPPLE AND A FEW ABRASIONS NOTED.  NIPPLE SHIELD SIZE INCREASED TO 24 MM.  COMFORT GELS GIVEN WITH INSTRUCTIONS.  OBSERVED BABY FEED WELL WITH GOOD MILK TRANSFER AND SHIELD FULL OF MILK AFTER FEEDING.  BABY CAME OFF BREAST VERY RELAXED AND CONTENT.  MOM WILL CONTINUE TO EXCLUSIVELY BREASTFEED, WEIGHT CHECK AT PEDI IN A FEW DAYS AND WILL ATTEND SUPPORT GROUPS.    Total amount transferred:  40 ml Total supplement given:  0 ml

## 2014-05-19 ENCOUNTER — Ambulatory Visit (HOSPITAL_COMMUNITY): Payer: BC Managed Care – PPO

## 2014-05-29 ENCOUNTER — Telehealth (HOSPITAL_COMMUNITY): Payer: Self-pay | Admitting: Lactation Services

## 2014-05-29 NOTE — Telephone Encounter (Signed)
Ms. Brossard called reporting that she has swelling and tender on right breast, fever, nausea. She has been pump and bottle feeding. She reports pumping every 2-3 hours for 10 minutes, receiving 2-3 oz combined. Ms Asbill spoke with her OB today and was advised if no improvement by tomorrow to call to be seen in the office. She requested info from [place] about how to manage this clogged milk duct. LC advised Ms Vita to pump every 2-3 hours for 15-30 minutes, prior to pumping apply warm compress to tender area, massage this area during the pumping to help clogged milk duct release milk, apply ice after pumping and take Ibuprofren as directed. If no improvement in am call OB to be seen. Advised Ms Lingerfelt she may need antibiotics to resolve this as she is getting mastitis. Ms Warwick reports understanding. She also requested info about where to return her 2 week pump rental. Info given to return to [facility] or Education center at [facility] store or with receptionist.

## 2014-09-05 ENCOUNTER — Encounter (HOSPITAL_COMMUNITY): Payer: Self-pay | Admitting: Obstetrics and Gynecology

## 2014-09-06 ENCOUNTER — Ambulatory Visit: Payer: BC Managed Care – PPO | Admitting: Family

## 2014-09-28 ENCOUNTER — Ambulatory Visit: Payer: BC Managed Care – PPO | Admitting: Family

## 2014-11-30 ENCOUNTER — Encounter: Payer: Self-pay | Admitting: Family

## 2014-11-30 ENCOUNTER — Ambulatory Visit (INDEPENDENT_AMBULATORY_CARE_PROVIDER_SITE_OTHER): Payer: BLUE CROSS/BLUE SHIELD | Admitting: Family

## 2014-11-30 VITALS — BP 118/62 | HR 84 | Temp 98.1°F | Ht 62.0 in | Wt 162.0 lb

## 2014-11-30 DIAGNOSIS — H9313 Tinnitus, bilateral: Secondary | ICD-10-CM

## 2014-11-30 DIAGNOSIS — R002 Palpitations: Secondary | ICD-10-CM

## 2014-11-30 DIAGNOSIS — M329 Systemic lupus erythematosus, unspecified: Secondary | ICD-10-CM

## 2014-11-30 LAB — CBC WITH DIFFERENTIAL/PLATELET
Basophils Absolute: 0 10*3/uL (ref 0.0–0.1)
Basophils Relative: 0.9 % (ref 0.0–3.0)
EOS ABS: 0.1 10*3/uL (ref 0.0–0.7)
Eosinophils Relative: 1.7 % (ref 0.0–5.0)
HEMATOCRIT: 37.9 % (ref 36.0–46.0)
Hemoglobin: 12.5 g/dL (ref 12.0–15.0)
LYMPHS ABS: 1.8 10*3/uL (ref 0.7–4.0)
Lymphocytes Relative: 50.9 % — ABNORMAL HIGH (ref 12.0–46.0)
MCHC: 33 g/dL (ref 30.0–36.0)
MCV: 87.2 fl (ref 78.0–100.0)
MONO ABS: 0.5 10*3/uL (ref 0.1–1.0)
MONOS PCT: 12.8 % — AB (ref 3.0–12.0)
Neutro Abs: 1.2 10*3/uL — ABNORMAL LOW (ref 1.4–7.7)
Neutrophils Relative %: 33.7 % — ABNORMAL LOW (ref 43.0–77.0)
Platelets: 201 10*3/uL (ref 150.0–400.0)
RBC: 4.35 Mil/uL (ref 3.87–5.11)
RDW: 12.8 % (ref 11.5–15.5)
WBC: 3.5 10*3/uL — AB (ref 4.0–10.5)

## 2014-11-30 LAB — COMPREHENSIVE METABOLIC PANEL
ALBUMIN: 4.4 g/dL (ref 3.5–5.2)
ALT: 14 U/L (ref 0–35)
AST: 20 U/L (ref 0–37)
Alkaline Phosphatase: 85 U/L (ref 39–117)
BILIRUBIN TOTAL: 0.6 mg/dL (ref 0.2–1.2)
BUN: 10 mg/dL (ref 6–23)
CO2: 28 meq/L (ref 19–32)
CREATININE: 0.58 mg/dL (ref 0.40–1.20)
Calcium: 9.6 mg/dL (ref 8.4–10.5)
Chloride: 103 mEq/L (ref 96–112)
GFR: 155.95 mL/min (ref >60.00)
Glucose, Bld: 87 mg/dL (ref 70–99)
Potassium: 3.8 mEq/L (ref 3.5–5.1)
SODIUM: 138 meq/L (ref 135–145)
TOTAL PROTEIN: 7.6 g/dL (ref 6.0–8.3)

## 2014-11-30 LAB — TSH: TSH: 1.39 u[IU]/mL (ref 0.35–4.50)

## 2014-11-30 NOTE — Patient Instructions (Signed)
Tinnitus °Sounds you hear in your ears and coming from within the ear is called tinnitus. This can be a symptom of many ear disorders. It is often associated with hearing loss.  °Tinnitus can be seen with: °· Infections. °· Ear blockages such as wax buildup. °· Meniere's disease. °· Ear damage. °· Inherited. °· Occupational causes. °While irritating, it is not usually a threat to health. When the cause of the tinnitus is wax, infection in the middle ear, or foreign body it is easily treated. Hearing loss will usually be reversible.  °TREATMENT  °When treating the underlying cause does not get rid of tinnitus, it may be necessary to get rid of the unwanted sound by covering it up with more pleasant background noises. This may include music, the radio etc. There are tinnitus maskers which can be worn which produce background noise to cover up the tinnitus. °Avoid all medications which tend to make tinnitus worse such as alcohol, caffeine, aspirin, and nicotine. There are many soothing background tapes such as rain, ocean, thunderstorms, etc. These soothing sounds help with sleeping or resting. °Keep all follow-up appointments and referrals. This is important to identify the cause of the problem. It also helps avoid complications, impaired hearing, disability, or chronic pain. °Document Released: 10/21/2005 Document Revised: 01/13/2012 Document Reviewed: 06/08/2008 °ExitCare® Patient Information ©2015 [employer]. This information is not intended to replace advice given to you by your health care provider. Make sure you discuss any questions you have with your health care provider. ° °

## 2014-11-30 NOTE — Progress Notes (Signed)
Subjective:    Patient ID: Sheila Bates, female    DOB: 23-Dec-1985, 29 y.o.   MRN: [id number]  HPI  29 year old African-American female, nonsmoker with a history of lupus is in today to be established. Denies any complications from lupus at this time. Is seeing rheumatology and follows up with him every 6 months. Reports the diagnoses was incidentally found when she was sick with influenza and 2010. Has concerns today of ringing in both ears  for several months lasting a few seconds then completely resolves. Has a history of ear infections as a child but has not had any issues as an adult. Also has concerns that her heart rate will suddenly speed up then go back to normal denies any associated chest pain, shortness of breat assess overall h or palpitations. Denies any increase in stress. Consumes about 3 caffeinated beverages a week.   Review of Systems  Constitutional: Negative.   HENT: Positive for tinnitus. Negative for congestion.   Respiratory: Negative.   Cardiovascular: Positive for palpitations. Negative for chest pain and leg swelling.  Gastrointestinal: Negative.   Endocrine: Negative.   Genitourinary: Negative.   Musculoskeletal: Negative.   Skin: Negative.   Allergic/Immunologic: Negative.   Neurological: Negative.   Hematological: Negative.   Psychiatric/Behavioral: Negative.    Past Medical History  Diagnosis Date  . Lupus   . Vaginal Pap smear, abnormal     History   Social History  . Marital Status: Married    Spouse Name: N/A    Number of Children: N/A  . Years of Education: N/A   Occupational History  . Not on file.   Social History Main Topics  . Smoking status: Never Smoker   . Smokeless tobacco: Never Used  . Alcohol Use: No  . Drug Use: No  . Sexual Activity: Yes   Other Topics Concern  . Not on file   Social History Narrative    Past Surgical History  Procedure Laterality Date  . Bartholin gland cyst excision    . Cesarean section N/A  05/12/2014    Procedure: Primary Cesarean Section Delivery Baby Girl @ 2353, Apgars;  Surgeon: Lavina Hamman, MD;  Location: [facility];  Service: Obstetrics;  Laterality: N/A;    Family History  Problem Relation Age of Onset  . Lupus Maternal Aunt   . Breast cancer Paternal Aunt   . Lupus Maternal Grandmother   . Diabetes Paternal Grandmother     No Known Allergies  Current Outpatient Prescriptions on File Prior to Visit  Medication Sig Dispense Refill  . Prenatal Vit-Fe Fumarate-FA (PRENATAL MULTIVITAMIN) TABS tablet Take 1 tablet by mouth daily at 12 noon.     No current facility-administered medications on file prior to visit.    BP 118/62 mmHg  Pulse 84  Temp(Src) 98.1 F (36.7 C) (Oral)  Ht 5\' 2"  (1.575 m)  Wt 162 lb (73.483 kg)  BMI 29.62 kg/m2chart    Objective:   Physical Exam  Constitutional: She is oriented to person, place, and time. She appears well-developed and well-nourished.  HENT:  Right Ear: External ear normal.  Left Ear: External ear normal.  Nose: Nose normal.  Mouth/Throat: Oropharynx is clear and moist.  Neck: Normal range of motion. Neck supple.  Cardiovascular: Normal rate and normal heart sounds.   Pulmonary/Chest: Effort normal and breath sounds normal.  Abdominal: Soft. Bowel sounds are normal.  Musculoskeletal: Normal range of motion.  Neurological: She is alert and oriented to person, place, and time.  Skin: Skin is warm and dry.  Psychiatric: She has a normal mood and affect.          Assessment & Plan:  Tiyonna was seen today for establish care.  Diagnoses and associated orders for this visit:  Lupus (systemic lupus erythematosus) - TSH - CMP - CBC with Differential - EKG 12-Lead  Tinnitus of both ears - TSH - CMP - CBC with Differential  Palpitations - TSH - CMP - CBC with Differential - EKG 12-Lead    Obtain labs today will notify patient pending results. Call the office with any questions or concerns. Follow-up  with rheumatology as scheduled and sooner as needed.

## 2014-11-30 NOTE — Progress Notes (Signed)
Pre visit review using our clinic review tool, if applicable. No additional management support is needed unless otherwise documented below in the visit note. 

## 2015-01-19 ENCOUNTER — Ambulatory Visit (INDEPENDENT_AMBULATORY_CARE_PROVIDER_SITE_OTHER): Payer: BLUE CROSS/BLUE SHIELD | Admitting: Family Medicine

## 2015-01-19 ENCOUNTER — Encounter: Payer: Self-pay | Admitting: Family Medicine

## 2015-01-19 VITALS — BP 98/60 | HR 83 | Temp 98.6°F | Ht 62.0 in | Wt 159.4 lb

## 2015-01-19 DIAGNOSIS — J069 Acute upper respiratory infection, unspecified: Secondary | ICD-10-CM

## 2015-01-19 MED ORDER — BENZONATATE 100 MG PO CAPS: 100.0000 mg | ORAL_CAPSULE | Freq: Two times a day (BID) | ORAL | Status: DC | PRN

## 2015-01-19 NOTE — Progress Notes (Signed)
Pre visit review using our clinic review tool, if applicable. No additional management support is needed unless otherwise documented below in the visit note. 

## 2015-01-19 NOTE — Patient Instructions (Addendum)
INSTRUCTIONS FOR UPPER RESPIRATORY INFECTION:  -plenty of rest and fluids  -nasal saline wash 2-3 times daily (use prepackaged nasal saline or bottled/distilled water if making your own)   -can use sinex nasal spray for drainage and nasal congestion - but do NOT use longer then 3-4 days  -can use tylenol or ibuprofen as directed for aches and sore throat if no liver or kidney issues  -in the winter time, using a humidifier at night is helpful (please follow cleaning instructions)  -if you are taking a cough medication - use only as directed, may also try a teaspoon of honey to coat the throat and throat lozenges  -for sore throat, salt water gargles can help  -follow up if you have fevers, facial pain, tooth pain, difficulty breathing or are worsening or not getting better   -schedule new patient visit with Kandee Keen or one of the other providers

## 2015-01-19 NOTE — Progress Notes (Signed)
   HPI:  URI -started: 2 days ago -symptoms:nasal congestion, sore throat, cough -denies:fever, SOB, NVD, tooth pain - husband with similar symptoms -has tried: sudafed -sick contacts/travel/risks: denies flu exposure, tick exposure or or Ebola risks  ROS: See pertinent positives and negatives per HPI.  Past Medical History  Diagnosis Date  . Lupus   . Vaginal Pap smear, abnormal     Past Surgical History  Procedure Laterality Date  . Bartholin gland cyst excision    . Cesarean section N/A 05/12/2014    Procedure: Primary Cesarean Section Delivery Baby Girl @ 2353, Apgars;  Surgeon: Lavina Hamman, MD;  Location: [facility];  Service: Obstetrics;  Laterality: N/A;    Family History  Problem Relation Age of Onset  . Lupus Maternal Aunt   . Breast cancer Paternal Aunt   . Lupus Maternal Grandmother   . Diabetes Paternal Grandmother     History   Social History  . Marital Status: Married    Spouse Name: N/A  . Number of Children: N/A  . Years of Education: N/A   Social History Main Topics  . Smoking status: Never Smoker   . Smokeless tobacco: Never Used  . Alcohol Use: No  . Drug Use: No  . Sexual Activity: Yes   Other Topics Concern  . None   Social History Narrative     Current outpatient prescriptions:  .  Prenatal Vit-Fe Fumarate-FA (PRENATAL MULTIVITAMIN) TABS tablet, Take 1 tablet by mouth daily at 12 noon., Disp: , Rfl:  .  benzonatate (TESSALON) 100 MG capsule, Take 1 capsule (100 mg total) by mouth 2 (two) times daily as needed for cough., Disp: 20 capsule, Rfl: 0  EXAM:  Filed Vitals:   01/19/15 1411  BP: 98/60  Pulse: 83  Temp: 98.6 F (37 C)    Body mass index is 29.15 kg/(m^2).  GENERAL: vitals reviewed and listed above, alert, oriented, appears well hydrated and in no acute distress  HEENT: atraumatic, conjunttiva clear, no obvious abnormalities on inspection of external nose and ears, normal appearance of ear canals and TMs, clear nasal  congestion, mild post oropharyngeal erythema with PND, no tonsillar edema or exudate, no sinus TTP  NECK: no obvious masses on inspection  LUNGS: clear to auscultation bilaterally, no wheezes, rales or rhonchi, good air movement  CV: HRRR, no peripheral edema  MS: moves all extremities without noticeable abnormality  PSYCH: pleasant and cooperative, no obvious depression or anxiety  ASSESSMENT AND PLAN:  Discussed the following assessment and plan:  Acute upper respiratory infection  -given HPI and exam findings today, a serious infection or illness is unlikely. We discussed potential etiologies, with VURI being most likely, and advised supportive care and monitoring. We discussed treatment side effects, likely course, antibiotic misuse, transmission, and signs of developing a serious illness. -of course, we advised to return or notify a doctor immediately if symptoms worsen or persist or new concerns arise.    There are no Patient Instructions on file for this visit.   Kriste Basque R.

## 2015-03-14 ENCOUNTER — Telehealth: Payer: Self-pay | Admitting: Family Medicine

## 2015-03-14 ENCOUNTER — Emergency Department (HOSPITAL_COMMUNITY)
Admission: EM | Admit: 2015-03-14 | Discharge: 2015-03-14 | Disposition: A | Discharge status: Home / Self Care | Payer: BLUE CROSS/BLUE SHIELD | Attending: Emergency Medicine | Admitting: Emergency Medicine

## 2015-03-14 ENCOUNTER — Emergency Department (HOSPITAL_COMMUNITY): Payer: BLUE CROSS/BLUE SHIELD

## 2015-03-14 ENCOUNTER — Encounter (HOSPITAL_COMMUNITY): Payer: Self-pay | Admitting: Emergency Medicine

## 2015-03-14 DIAGNOSIS — Z79899 Other long term (current) drug therapy: Secondary | ICD-10-CM | POA: Insufficient documentation

## 2015-03-14 DIAGNOSIS — R0789 Other chest pain: Secondary | ICD-10-CM | POA: Insufficient documentation

## 2015-03-14 DIAGNOSIS — Z8739 Personal history of other diseases of the musculoskeletal system and connective tissue: Secondary | ICD-10-CM | POA: Diagnosis not present

## 2015-03-14 DIAGNOSIS — Z3202 Encounter for pregnancy test, result negative: Secondary | ICD-10-CM | POA: Insufficient documentation

## 2015-03-14 DIAGNOSIS — R079 Chest pain, unspecified: Secondary | ICD-10-CM

## 2015-03-14 LAB — BASIC METABOLIC PANEL
Anion gap: 7 (ref 5–15)
BUN: 17 mg/dL (ref 6–20)
CO2: 24 mmol/L (ref 22–32)
Calcium: 8.8 mg/dL — ABNORMAL LOW (ref 8.9–10.3)
Chloride: 106 mmol/L (ref 101–111)
Creatinine, Ser: 0.6 mg/dL (ref 0.44–1.00)
GFR calc Af Amer: >60 mL/min (ref >60)
GFR calc non Af Amer: >60 mL/min (ref >60)
GLUCOSE: 101 mg/dL — AB (ref 70–99)
Potassium: 3.8 mmol/L (ref 3.5–5.1)
SODIUM: 137 mmol/L (ref 135–145)

## 2015-03-14 LAB — CBC
HEMATOCRIT: 36.6 % (ref 36.0–46.0)
Hemoglobin: 11.6 g/dL — ABNORMAL LOW (ref 12.0–15.0)
MCH: 28.2 pg (ref 26.0–34.0)
MCHC: 31.7 g/dL (ref 30.0–36.0)
MCV: 88.8 fL (ref 78.0–100.0)
Platelets: 236 10*3/uL (ref 150–400)
RBC: 4.12 MIL/uL (ref 3.87–5.11)
RDW: 12.7 % (ref 11.5–15.5)
WBC: 4.7 10*3/uL (ref 4.0–10.5)

## 2015-03-14 LAB — I-STAT BETA HCG BLOOD, ED (MC, WL, AP ONLY): I-stat hCG, quantitative: <5 m[IU]/mL (ref <5)

## 2015-03-14 LAB — I-STAT TROPONIN, ED: Troponin i, poc: 0.01 ng/mL (ref 0.00–0.08)

## 2015-03-14 MED ORDER — IBUPROFEN 800 MG PO TABS
800.0000 mg | ORAL_TABLET | Freq: Once | ORAL | Status: AC
  Administered 2015-03-14: 800 mg via ORAL
  Filled 2015-03-14: qty 1

## 2015-03-14 MED ORDER — METHOCARBAMOL 750 MG PO TABS: 750.0000 mg | ORAL_TABLET | Freq: Four times a day (QID) | ORAL | Status: DC

## 2015-03-14 MED ORDER — IBUPROFEN 600 MG PO TABS: 600.0000 mg | ORAL_TABLET | Freq: Four times a day (QID) | ORAL | Status: DC | PRN

## 2015-03-14 NOTE — Discharge Instructions (Signed)

## 2015-03-14 NOTE — ED Provider Notes (Signed)
CSN: [id number]     Arrival date & time 03/14/15  1646 History   First MD Initiated Contact with Patient 03/14/15 1750     Chief Complaint  Patient presents with  . Chest Pain     (Consider location/radiation/quality/duration/timing/severity/associated sxs/prior Treatment) HPI Comments: Patient here complaining of left-sided chest pain characterized as sharp and pinpoint that has been persistent today. No associated anginal type symptoms. Denies any associated dyspnea, nausea, vomiting. No cough or congestion. No fever or chills. Denies any rashes to the area. No pleuritic component to this. Patient is active exercise and notes no intolerance to this. Symptoms persistent and worse with certain positions. No treatment used prior to arrival. No prior history of same.  Patient is a 29 y.o. female presenting with chest pain. The history is provided by the patient.  Chest Pain   Past Medical History  Diagnosis Date  . Lupus   . Vaginal Pap smear, abnormal    Past Surgical History  Procedure Laterality Date  . Bartholin gland cyst excision    . Cesarean section N/A 05/12/2014    Procedure: Primary Cesarean Section Delivery Baby Girl @ 2353, Apgars;  Surgeon: Lavina Hamman, MD;  Location: [facility];  Service: Obstetrics;  Laterality: N/A;   Family History  Problem Relation Age of Onset  . Lupus Maternal Aunt   . Breast cancer Paternal Aunt   . Lupus Maternal Grandmother   . Diabetes Paternal Grandmother    History  Substance Use Topics  . Smoking status: Never Smoker   . Smokeless tobacco: Never Used  . Alcohol Use: No   OB History    Gravida Para Term Preterm AB TAB SAB Ectopic Multiple Living   2 1 1  0 1 1 0 0 0 1     Review of Systems  Cardiovascular: Positive for chest pain.  All other systems reviewed and are negative.     Allergies  Review of patient's allergies indicates no known allergies.  Home Medications   Prior to Admission medications   Medication Sig Start  Date End Date Taking? Authorizing Provider  benzonatate (TESSALON) 100 MG capsule Take 1 capsule (100 mg total) by mouth 2 (two) times daily as needed for cough. 01/19/15   01/21/15, DO  Prenatal Vit-Fe Fumarate-FA (PRENATAL MULTIVITAMIN) TABS tablet Take 1 tablet by mouth daily at 12 noon.    Historical Provider, MD   BP 129/80 mmHg  Pulse 86  Temp(Src) 98.4 F (36.9 C) (Oral)  Resp 20  SpO2 100%  LMP  (LMP Unknown) Physical Exam  Constitutional: She is oriented to person, place, and time. She appears well-developed and well-nourished.  Non-toxic appearance. No distress.  HENT:  Head: Normocephalic and atraumatic.  Eyes: Conjunctivae, EOM and lids are normal. Pupils are equal, round, and reactive to light.  Neck: Normal range of motion. Neck supple. No tracheal deviation present. No thyroid mass present.  Cardiovascular: Normal rate, regular rhythm and normal heart sounds.  Exam reveals no gallop.   No murmur heard. Pulmonary/Chest: Effort normal and breath sounds normal. No stridor. No respiratory distress. She has no decreased breath sounds. She has no wheezes. She has no rhonchi. She has no rales.    Abdominal: Soft. Normal appearance and bowel sounds are normal. She exhibits no distension. There is no tenderness. There is no rebound and no CVA tenderness.  Musculoskeletal: Normal range of motion. She exhibits no edema or tenderness.  Neurological: She is alert and oriented to person, place, and time.  She has normal strength. No cranial nerve deficit or sensory deficit. GCS eye subscore is 4. GCS verbal subscore is 5. GCS motor subscore is 6.  Skin: Skin is warm and dry. No abrasion and no rash noted.  Psychiatric: She has a normal mood and affect. Her speech is normal and behavior is normal.  Nursing note and vitals reviewed.   ED Course  Procedures (including critical care time) Labs Review Labs Reviewed  CBC - Abnormal; Notable for the following:    Hemoglobin 11.6 (*)      All other components within normal limits  BASIC METABOLIC PANEL - Abnormal; Notable for the following:    Glucose, Bld 101 (*)    Calcium 8.8 (*)    All other components within normal limits  I-STAT TROPOININ, ED  POC URINE PREG, ED  I-STAT BETA HCG BLOOD, ED ([facility], [facility], [facility] ONLY)  POC URINE PREG, ED    Imaging Review Dg Chest 2 View  03/14/2015   CLINICAL DATA:  Chest pain  EXAM: CHEST  2 VIEW  COMPARISON:  None.  FINDINGS: Normal heart size and mediastinal contours. No acute infiltrate or edema. No effusion or pneumothorax. No acute osseous findings.  IMPRESSION: Negative chest.   Electronically Signed   By: Marnee Spring M.D.   On: 03/14/2015 17:56     EKG Interpretation   Date/Time:  Tuesday Mar 14 2015 16:58:32 EDT Ventricular Rate:  85 PR Interval:  147 QRS Duration: 70 QT Interval:  351 QTC Calculation: 417 R Axis:   79 Text Interpretation:  Sinus rhythm Probable left atrial enlargement  Confirmed by Keng Jewel  MD, Aaralyn Kil ([id number]) on 03/14/2015 5:50:58 PM      MDM   Final diagnoses:  Chest pain    Patient likely chest wall pain. Doubt ACS or pulmonary edema wasn't. She has pinpoint tenderness along her left chest wall. Given Motrin here and will be prescribed an NSAID to go home with    Lorre Nick, MD 03/14/15 2017

## 2015-03-14 NOTE — Telephone Encounter (Signed)
FYI

## 2015-03-14 NOTE — ED Notes (Signed)
Pt states mild chest pain starting yesterday increasing into today. States pain has spread to left shoulder and what feels to be a "knot" in the back of her left rib. Pt denies nausea, vomiting, fever, chills, SOB, sweating, and weakness. Family hx of heart attacks, she says no personal heart history.

## 2015-03-14 NOTE — Telephone Encounter (Signed)
[facility] - Client TELEPHONE ADVICE RECORD   [facility]     Patient Name: [facility] Initial Comment Caller states having chest pain going to back on left side.   DOB: 1986/07/23      Nurse Assessment  Nurse: Harlon Flor, RN, Darl Pikes Date/Time (Eastern Time): 03/14/2015 4:26:18 PM  Confirm and document reason for call. If symptomatic, describe symptoms. ---Caller states having chest pain going to back on left side. shoulder hurts too Pulse was really fast over the weekend No fever or recent illnesses did get [facility] implant placed a month ago   Has the patient traveled out of the country within the last 30 days? ---No   Does the patient require triage? ---Yes   Related visit to physician within the last 2 weeks? ---No   Does the PT have any chronic conditions? (i.e. diabetes, asthma, etc.) ---No   Did the patient indicate they were pregnant? ---No     Guidelines     Guideline Title Affirmed Question Affirmed Notes   Chest Pain Pain also present in shoulder(s) or arm(s) or jaw (Exception: pain is clearly made worse by movement)    Final Disposition User   Go to ED Now Harlon Flor, RN, Darl Pikes

## 2015-03-15 NOTE — Telephone Encounter (Signed)
Noted  

## 2015-03-30 ENCOUNTER — Encounter (HOSPITAL_COMMUNITY): Payer: Self-pay | Admitting: Emergency Medicine

## 2015-03-30 ENCOUNTER — Emergency Department (HOSPITAL_COMMUNITY)
Admission: EM | Admit: 2015-03-30 | Discharge: 2015-03-30 | Disposition: A | Discharge status: Home / Self Care | Payer: BLUE CROSS/BLUE SHIELD | Source: Home / Self Care | Attending: Family Medicine | Admitting: Family Medicine

## 2015-03-30 DIAGNOSIS — J0100 Acute maxillary sinusitis, unspecified: Secondary | ICD-10-CM | POA: Diagnosis not present

## 2015-03-30 MED ORDER — IPRATROPIUM BROMIDE 0.06 % NA SOLN: 2.0000 | Freq: Four times a day (QID) | NASAL | Status: DC

## 2015-03-30 NOTE — ED Provider Notes (Signed)
Sheila Bates is a 29 y.o. female who presents to Urgent Care today for nasal congestion and itchy scratchy throat associated with mild subjective fevers and a productive cough. No nausea vomiting diarrhea or trouble breathing. She's used Tylenol and Sudafed. Symptoms present for a few days.   Past Medical History  Diagnosis Date  . Lupus   . Vaginal Pap smear, abnormal    Past Surgical History  Procedure Laterality Date  . Bartholin gland cyst excision    . Cesarean section N/A 05/12/2014    Procedure: Primary Cesarean Section Delivery Baby Girl @ 2353, Apgars;  Surgeon: Lavina Hamman, MD;  Location: [facility];  Service: Obstetrics;  Laterality: N/A;   History  Substance Use Topics  . Smoking status: Never Smoker   . Smokeless tobacco: Never Used  . Alcohol Use: No   ROS as above Medications: No current facility-administered medications for this encounter.   Current Outpatient Prescriptions  Medication Sig Dispense Refill  . ibuprofen (ADVIL,MOTRIN) 600 MG tablet Take 1 tablet (600 mg total) by mouth every 6 (six) hours as needed. 30 tablet 0  . ipratropium (ATROVENT) 0.06 % nasal spray Place 2 sprays into both nostrils 4 (four) times daily. 15 mL 1  . methocarbamol (ROBAXIN-750) 750 MG tablet Take 1 tablet (750 mg total) by mouth 4 (four) times daily. 30 tablet 0   No Known Allergies   Exam:  BP 113/69 mmHg  Pulse 85  Temp(Src) 98.3 F (36.8 C) (Oral)  Resp 16  SpO2 99%  LMP 03/19/2015 Gen: Well NAD HEENT: EOMI,  MMM normal posterior pharynx and tympanic membranes. Clear nasal discharge. Lungs: Normal work of breathing. CTABL Heart: RRR no MRG Abd: NABS, Soft. Nondistended, Nontender Exts: Brisk capillary refill, warm and well perfused.   No results found for this or any previous visit (from the past 24 hour(s)). No results found.  Assessment and Plan: 29 y.o. female with viral URI. Treatment with Atrovent nasal spray. Continue over-the-counter medications. Patient  declined  codeine cough syrup.  Discussed warning signs or symptoms. Please see discharge instructions. Patient expresses understanding.     Rodolph Bong, MD 03/30/15 Mikle Bosworth

## 2015-03-30 NOTE — Discharge Instructions (Signed)
Thank you for coming in today. °Call or go to the emergency room if you get worse, have trouble breathing, have chest pains, or palpitations.  ° °Sinusitis °Sinusitis is redness, soreness, and inflammation of the paranasal sinuses. Paranasal sinuses are air pockets within the bones of your face (beneath the eyes, the middle of the forehead, or above the eyes). In healthy paranasal sinuses, mucus is able to drain out, and air is able to circulate through them by way of your nose. However, when your paranasal sinuses are inflamed, mucus and air can become trapped. This can allow bacteria and other germs to grow and cause infection. °Sinusitis can develop quickly and last only a short time (acute) or continue over a long period (chronic). Sinusitis that lasts for more than 12 weeks is considered chronic.  °CAUSES  °Causes of sinusitis include: °· Allergies. °· Structural abnormalities, such as displacement of the cartilage that separates your nostrils (deviated septum), which can decrease the air flow through your nose and sinuses and affect sinus drainage. °· Functional abnormalities, such as when the small hairs (cilia) that line your sinuses and help remove mucus do not work properly or are not present. °SIGNS AND SYMPTOMS  °Symptoms of acute and chronic sinusitis are the same. The primary symptoms are pain and pressure around the affected sinuses. Other symptoms include: °· Upper toothache. °· Earache. °· Headache. °· Bad breath. °· Decreased sense of smell and taste. °· A cough, which worsens when you are lying flat. °· Fatigue. °· Fever. °· Thick drainage from your nose, which often is green and may contain pus (purulent). °· Swelling and warmth over the affected sinuses. °DIAGNOSIS  °Your health care provider will perform a physical exam. During the exam, your health care provider may: °· Look in your nose for signs of abnormal growths in your nostrils (nasal polyps). °· Tap over the affected sinus to check for  signs of infection. °· View the inside of your sinuses (endoscopy) using an imaging device that has a light attached (endoscope). °If your health care provider suspects that you have chronic sinusitis, one or more of the following tests may be recommended: °· Allergy tests. °· Nasal culture. A sample of mucus is taken from your nose, sent to a lab, and screened for bacteria. °· Nasal cytology. A sample of mucus is taken from your nose and examined by your health care provider to determine if your sinusitis is related to an allergy. °TREATMENT  °Most cases of acute sinusitis are related to a viral infection and will resolve on their own within 10 days. Sometimes medicines are prescribed to help relieve symptoms (pain medicine, decongestants, nasal steroid sprays, or saline sprays).  °However, for sinusitis related to a bacterial infection, your health care provider will prescribe antibiotic medicines. These are medicines that will help kill the bacteria causing the infection.  °Rarely, sinusitis is caused by a fungal infection. In theses cases, your health care provider will prescribe antifungal medicine. °For some cases of chronic sinusitis, surgery is needed. Generally, these are cases in which sinusitis recurs more than 3 times per year, despite other treatments. °HOME CARE INSTRUCTIONS  °· Drink plenty of water. Water helps thin the mucus so your sinuses can drain more easily. °· Use a humidifier. °· Inhale steam 3 to 4 times a day (for example, sit in the bathroom with the shower running). °· Apply a warm, moist washcloth to your face 3 to 4 times a day, or as directed by your   health care provider. °· Use saline nasal sprays to help moisten and clean your sinuses. °· Take medicines only as directed by your health care provider. °· If you were prescribed either an antibiotic or antifungal medicine, finish it all even if you start to feel better. °SEEK IMMEDIATE MEDICAL CARE IF: °· You have increasing pain or  severe headaches. °· You have nausea, vomiting, or drowsiness. °· You have swelling around your face. °· You have vision problems. °· You have a stiff neck. °· You have difficulty breathing. °MAKE SURE YOU:  °· Understand these instructions. °· Will watch your condition. °· Will get help right away if you are not doing well or get worse. °Document Released: 10/21/2005 Document Revised: 03/07/2014 Document Reviewed: 11/05/2011 °ExitCare® Patient Information ©2015 [employer]. This information is not intended to replace advice given to you by your health care provider. Make sure you discuss any questions you have with your health care provider. ° °

## 2015-04-10 ENCOUNTER — Encounter: Payer: Self-pay | Admitting: Family Medicine

## 2015-04-10 ENCOUNTER — Ambulatory Visit (INDEPENDENT_AMBULATORY_CARE_PROVIDER_SITE_OTHER): Payer: BLUE CROSS/BLUE SHIELD | Admitting: Family Medicine

## 2015-04-10 VITALS — BP 120/80 | HR 88 | Temp 98.3°F | Wt 154.0 lb

## 2015-04-10 DIAGNOSIS — J0140 Acute pansinusitis, unspecified: Secondary | ICD-10-CM | POA: Diagnosis not present

## 2015-04-10 MED ORDER — AMOXICILLIN 875 MG PO TABS: 875.0000 mg | ORAL_TABLET | Freq: Two times a day (BID) | ORAL | Status: DC

## 2015-04-10 NOTE — Patient Instructions (Signed)

## 2015-04-10 NOTE — Progress Notes (Signed)
   Subjective:    Patient ID: Sheila Bates, female    DOB: Sep 21, 1986, 29 y.o.   MRN: [id number]  HPI  Patient seen with 2 week history of cough and nasal congestion. She's had some pansinusitis symptoms frontal and maxillary sinuses and greenish nasal discharge. She has no history of asthma. Nonsmoker. Possible low-grade fever intermittently. No documented fever since last Friday. Increased malaise. Intermittent headaches. No sick contacts.  Past Medical History  Diagnosis Date  . Lupus   . Vaginal Pap smear, abnormal    Past Surgical History  Procedure Laterality Date  . Bartholin gland cyst excision    . Cesarean section N/A 05/12/2014    Procedure: Primary Cesarean Section Delivery Baby Girl @ 2353, Apgars;  Surgeon: Lavina Hamman, MD;  Location: [facility];  Service: Obstetrics;  Laterality: N/A;    reports that she has never smoked. She has never used smokeless tobacco. She reports that she does not drink alcohol or use illicit drugs. family history includes Breast cancer in her paternal aunt; Diabetes in her paternal grandmother; Lupus in her maternal aunt and maternal grandmother. No Known Allergies   Review of Systems  HENT: Positive for congestion and sinus pressure.   Respiratory: Positive for cough.   Neurological: Positive for headaches.       Objective:   Physical Exam  Constitutional: She appears well-developed and well-nourished.  HENT:  Head: Normocephalic and atraumatic.  Mouth/Throat: Oropharynx is clear and moist.  She has some mild erythema involving the right and left eardrums.  Neck: Neck supple.  Cardiovascular: Normal rate and regular rhythm.   Pulmonary/Chest: Effort normal and breath sounds normal. No respiratory distress. She has no wheezes. She has no rales.  Lymphadenopathy:    She has no cervical adenopathy.          Assessment & Plan:  Probable pansinusitis. She's had progressive symptoms over 2 weeks. Possible early acute otitis media  changes bilaterally. Start amoxicillin 875 mg twice daily for 10 days.

## 2015-04-10 NOTE — Progress Notes (Signed)
Pre visit review using our clinic review tool, if applicable. No additional management support is needed unless otherwise documented below in the visit note. 

## 2015-04-12 ENCOUNTER — Ambulatory Visit: Payer: BLUE CROSS/BLUE SHIELD | Admitting: Family Medicine

## 2015-05-19 ENCOUNTER — Telehealth: Payer: Self-pay | Admitting: Family

## 2015-05-19 NOTE — Telephone Encounter (Signed)
I called the pt and she stated she would say she taste now at a rate of 30 percent and even after taking the antibiotic, initially before taking the antibiotic she could not smell or taste anything. She denies any other symptoms and states she can breathe better now.

## 2015-05-19 NOTE — Telephone Encounter (Signed)
Did she have any improvement with taste and smell following the antibiotic?

## 2015-05-19 NOTE — Telephone Encounter (Signed)
Pt call to say she saw Dr Caryl Never in June for a sinus infection and she could not taste or smell. She said she is still having problems with taste or smell. She is asking if he think she need another antibiotic .Marland Kitchen

## 2015-05-21 NOTE — Telephone Encounter (Signed)
Let's broaden her coverage to Augmentin 875 mg po bid for 10 days and if symptoms of smell and taste not better after that be in touch.

## 2015-05-22 MED ORDER — AMOXICILLIN-POT CLAVULANATE 875-125 MG PO TABS: 1.0000 | ORAL_TABLET | Freq: Two times a day (BID) | ORAL | Status: DC

## 2015-05-22 NOTE — Telephone Encounter (Signed)
Left a message for the pt to return my call.  

## 2015-05-22 NOTE — Telephone Encounter (Signed)
I spoke with the pt and she is aware the Rx was sent to her pharmacy and will call back if she is not better.

## 2015-06-02 ENCOUNTER — Encounter: Payer: Self-pay | Admitting: Family

## 2015-06-02 ENCOUNTER — Ambulatory Visit (INDEPENDENT_AMBULATORY_CARE_PROVIDER_SITE_OTHER): Payer: BLUE CROSS/BLUE SHIELD | Admitting: Family

## 2015-06-02 VITALS — BP 112/62 | HR 80 | Temp 98.1°F | Ht 62.0 in | Wt 152.5 lb

## 2015-06-02 DIAGNOSIS — J309 Allergic rhinitis, unspecified: Secondary | ICD-10-CM | POA: Diagnosis not present

## 2015-06-02 DIAGNOSIS — J329 Chronic sinusitis, unspecified: Secondary | ICD-10-CM

## 2015-06-02 DIAGNOSIS — B349 Viral infection, unspecified: Secondary | ICD-10-CM

## 2015-06-02 DIAGNOSIS — B9789 Other viral agents as the cause of diseases classified elsewhere: Secondary | ICD-10-CM

## 2015-06-02 MED ORDER — PREDNISONE 20 MG PO TABS: 40.0000 mg | ORAL_TABLET | Freq: Every day | ORAL | Status: AC

## 2015-06-02 NOTE — Patient Instructions (Signed)

## 2015-06-02 NOTE — Progress Notes (Signed)
Pre visit review using our clinic review tool, if applicable. No additional management support is needed unless otherwise documented below in the visit note. 

## 2015-06-02 NOTE — Progress Notes (Signed)
Subjective:    Patient ID: Sheila Bates, female    DOB: May 13, 1986, 29 y.o.   MRN: [id number]  HPI Comments: 29 year old African American female here today with complaints of possible sinus infection.  States that she was diagnosed by PCP with a sinus infection and was given an antibiotic 2 months ago, which provided relief from pain and discomfort, but her taste and smell was minimal.  States that she received 2nd dosage of antibiotic 1 week ago, but she still has minimum senses to tastes and smells.  Patient states that she is not having any pain today, and hasn't had any episodes of shortness of breath.     Review of Systems  Constitutional: Negative.   HENT:       Reports minimal sense to tastes and smell.  Eyes: Negative.   Respiratory: Negative.   Cardiovascular: Negative.   Gastrointestinal: Negative.   Musculoskeletal: Negative.   Skin: Negative.   Allergic/Immunologic: Negative.   Neurological: Negative.   Psychiatric/Behavioral: Negative for behavioral problems and confusion.   Past Medical History  Diagnosis Date  . Lupus   . Vaginal Pap smear, abnormal     History   Social History  . Marital Status: Married    Spouse Name: N/A  . Number of Children: N/A  . Years of Education: N/A   Occupational History  . Not on file.   Social History Main Topics  . Smoking status: Never Smoker   . Smokeless tobacco: Never Used  . Alcohol Use: No  . Drug Use: No  . Sexual Activity: Yes   Other Topics Concern  . Not on file   Social History Narrative    Past Surgical History  Procedure Laterality Date  . Bartholin gland cyst excision    . Cesarean section N/A 05/12/2014    Procedure: Primary Cesarean Section Delivery Baby Girl @ 2353, Apgars;  Surgeon: Lavina Hamman, MD;  Location: [facility];  Service: Obstetrics;  Laterality: N/A;    Family History  Problem Relation Age of Onset  . Lupus Maternal Aunt   . Breast cancer Paternal Aunt   . Lupus Maternal  Grandmother   . Diabetes Paternal Grandmother     No Known Allergies  No current outpatient prescriptions on file prior to visit.   No current facility-administered medications on file prior to visit.    BP 112/62 mmHg  Pulse 80  Temp(Src) 98.1 F (36.7 C) (Oral)  Ht 5\' 2"  (1.575 m)  Wt 152 lb 8 oz (69.174 kg)  BMI 27.89 kg/m2  SpO2 95%  LMP 06/02/2015  Breastfeeding? Nochart    Objective:   Physical Exam  Constitutional: She is oriented to person, place, and time. She appears well-developed and well-nourished.  HENT:  Head: Normocephalic and atraumatic.  Right Ear: External ear normal.  Left Ear: External ear normal.  Nose: Nose normal.  Mouth/Throat: Oropharynx is clear and moist.  Eyes: EOM are normal. Pupils are equal, round, and reactive to light.  Neck: Normal range of motion.  Cardiovascular: Normal rate, regular rhythm, normal heart sounds and intact distal pulses.   Pulmonary/Chest: Effort normal and breath sounds normal.  Abdominal: Soft. Bowel sounds are normal.  Musculoskeletal: Normal range of motion.  Neurological: She is alert and oriented to person, place, and time. She has normal reflexes.  Skin: Skin is warm and dry.  Psychiatric: She has a normal mood and affect. Her behavior is normal. Judgment and thought content normal.  Assessment & Plan:  Diagnoses and all orders for this visit:  Allergic rhinitis, unspecified allergic rhinitis type  Viral sinusitis  Other orders -     predniSONE (DELTASONE) 20 MG tablet; Take 2 tablets (40 mg total) by mouth daily.   Rx for Prednisone 40mg  BID X 5 days.  Patient to call office if symptoms do not resolve or worsen.

## 2015-08-04 ENCOUNTER — Encounter: Payer: Self-pay | Admitting: Adult Health

## 2015-08-04 ENCOUNTER — Ambulatory Visit (INDEPENDENT_AMBULATORY_CARE_PROVIDER_SITE_OTHER): Payer: BLUE CROSS/BLUE SHIELD | Admitting: Adult Health

## 2015-08-04 DIAGNOSIS — R430 Anosmia: Secondary | ICD-10-CM

## 2015-08-04 DIAGNOSIS — R432 Parageusia: Secondary | ICD-10-CM

## 2015-08-04 LAB — COMPREHENSIVE METABOLIC PANEL
ALBUMIN: 4.3 g/dL (ref 3.5–5.2)
ALK PHOS: 56 U/L (ref 39–117)
ALT: 9 U/L (ref 0–35)
AST: 18 U/L (ref 0–37)
BUN: 12 mg/dL (ref 6–23)
CALCIUM: 9.3 mg/dL (ref 8.4–10.5)
CO2: 28 mEq/L (ref 19–32)
CREATININE: 0.8 mg/dL (ref 0.40–1.20)
Chloride: 106 mEq/L (ref 96–112)
GFR: 107.13 mL/min (ref >60.00)
Glucose, Bld: 87 mg/dL (ref 70–99)
Potassium: 4.1 mEq/L (ref 3.5–5.1)
Sodium: 140 mEq/L (ref 135–145)
TOTAL PROTEIN: 7.6 g/dL (ref 6.0–8.3)
Total Bilirubin: 0.5 mg/dL (ref 0.2–1.2)

## 2015-08-04 LAB — CBC WITH DIFFERENTIAL/PLATELET
BASOS PCT: 0.4 % (ref 0.0–3.0)
Basophils Absolute: 0 10*3/uL (ref 0.0–0.1)
EOS ABS: 0.1 10*3/uL (ref 0.0–0.7)
Eosinophils Relative: 1.8 % (ref 0.0–5.0)
HEMATOCRIT: 36.3 % (ref 36.0–46.0)
HEMOGLOBIN: 11.8 g/dL — AB (ref 12.0–15.0)
LYMPHS PCT: 34.4 % (ref 12.0–46.0)
Lymphs Abs: 1.6 10*3/uL (ref 0.7–4.0)
MCHC: 32.5 g/dL (ref 30.0–36.0)
MCV: 88.7 fl (ref 78.0–100.0)
Monocytes Absolute: 0.5 10*3/uL (ref 0.1–1.0)
Monocytes Relative: 11.2 % (ref 3.0–12.0)
Neutro Abs: 2.5 10*3/uL (ref 1.4–7.7)
Neutrophils Relative %: 52.2 % (ref 43.0–77.0)
Platelets: 205 10*3/uL (ref 150.0–400.0)
RBC: 4.09 Mil/uL (ref 3.87–5.11)
RDW: 12.9 % (ref 11.5–15.5)
WBC: 4.7 10*3/uL (ref 4.0–10.5)

## 2015-08-04 LAB — SEDIMENTATION RATE: SED RATE: 10 mm/h (ref 0–22)

## 2015-08-04 LAB — TSH: TSH: 0.93 u[IU]/mL (ref 0.35–4.50)

## 2015-08-04 NOTE — Progress Notes (Signed)
   Subjective:    Patient ID: Sheila Bates, female    DOB: 1986-07-19, 29 y.o.   MRN: [id number]  HPI  29 year old female who presents to the office today for the issue of unable to smell or taste. She first noticed that she had lost her sense of taste and smell around memorial day weekend, when she started to get sick with a sinus infection. In June she saw Dr. Caryl Never for pansinusitis type symptoms and was prescribed Amoxicillin BID x 10 days.   She took the abx which helped with the sinus infection but she was still unable to smell or taste anything. Two months later she saw NP Hyman Hopes and was given a prednisone taper, which she endorsed helped mildly, but when she finished it, she was still unable to smell or taste.   Over the last three weeks, she endorses that any smell she comes in contact with smells " burnt". No metallic or rancid taste   She feels like she is getting a sinus infection again. Her signs and symptoms are cough, sore throat, and sinus congestion. This started less than 24 hours ago.   Denies using zinc based products.   Review of Systems  Constitutional: Negative for fever and fatigue.  HENT: Positive for congestion, rhinorrhea, sinus pressure and sore throat. Negative for ear discharge and ear pain.   Respiratory: Negative.   Cardiovascular: Negative.   Allergic/Immunologic: Negative.   Neurological: Positive for dizziness, light-headedness and headaches. Negative for weakness and numbness.  Hematological: Negative.        Objective:   Physical Exam  Constitutional: She is oriented to person, place, and time. She appears well-developed and well-nourished. No distress.  HENT:  Head: Normocephalic and atraumatic.  Right Ear: External ear normal.  Left Ear: External ear normal.  Nose: Nose normal.  Mouth/Throat: Oropharynx is clear and moist.  No nasal polyps seen. No thrush in mouth.   She was unable to smell coffee ground in the office  Eyes: Conjunctivae  and EOM are normal. Pupils are equal, round, and reactive to light. Right eye exhibits no discharge. Left eye exhibits no discharge.  Neck: No thyromegaly present.  Musculoskeletal: Normal range of motion. She exhibits no edema or tenderness.  Lymphadenopathy:    She has no cervical adenopathy.  Neurological: She is alert and oriented to person, place, and time. She has normal reflexes. She displays normal reflexes. No cranial nerve deficit. She exhibits normal muscle tone. Coordination normal.  Skin: Skin is warm and dry. No rash noted. She is not diaphoretic. No erythema. No pallor.  Psychiatric: She has a normal mood and affect. Her behavior is normal. Judgment and thought content normal.  Nursing note and vitals reviewed.     Assessment & Plan:  1. Loss of taste & loss of smell - CBC with Differential/Platelet - CMP - Sedimentation Rate - TSH - ANA - Ambulatory referral to ENT - Nerve damage due to viral infection? Cannot r/o central nervous system disorder due to headaches. Less concern for Bells Palsy, Parkinson's, Alzheimers.  - Consider referral to Neurology

## 2015-08-04 NOTE — Progress Notes (Signed)
Pre visit review using our clinic review tool, if applicable. No additional management support is needed unless otherwise documented below in the visit note. 

## 2015-08-04 NOTE — Patient Instructions (Signed)
It was great meeting you today and I am sorry you are going through this.   I will follow up with you on your labs  Someone will call you to schedule an appointment with ENT

## 2015-08-07 LAB — ANTI-NUCLEAR AB-TITER (ANA TITER)

## 2015-08-07 LAB — ANA: Anti Nuclear Antibody(ANA): POSITIVE — AB

## 2015-08-10 ENCOUNTER — Telehealth: Payer: Self-pay | Admitting: Adult Health

## 2015-08-10 NOTE — Telephone Encounter (Signed)
Spoke to patient on the phone and informed her of her labs. Her ANA was positive but she has Lupus. Everything else negative

## 2016-02-23 ENCOUNTER — Ambulatory Visit (INDEPENDENT_AMBULATORY_CARE_PROVIDER_SITE_OTHER): Payer: BLUE CROSS/BLUE SHIELD | Admitting: Adult Health

## 2016-02-23 ENCOUNTER — Encounter: Payer: Self-pay | Admitting: Adult Health

## 2016-02-23 VITALS — BP 110/72 | Temp 98.1°F | Ht 62.0 in | Wt 158.1 lb

## 2016-02-23 DIAGNOSIS — J302 Other seasonal allergic rhinitis: Secondary | ICD-10-CM

## 2016-02-23 NOTE — Progress Notes (Signed)
Subjective:    Patient ID: Loraine Leriche, female    DOB: Mar 20, 1986, 30 y.o.   MRN: [id number]  HPI  30 year old female who presents to the office today for seasonal allergies. Her symptoms include that of itchy,watery eyes, rhinorrhea, headaches in the morning, and scratchy throat.   She denies any fever, nausea, vomiting, diarrhea,.   She has been taking Claritin for one week- off and on. When she does take it she notices that she feels better.    Review of Systems  Constitutional: Negative.   HENT: Positive for postnasal drip and rhinorrhea. Negative for ear discharge, ear pain, sinus pressure and sore throat.   Eyes: Positive for discharge and itching. Negative for photophobia, redness and visual disturbance.  Respiratory: Negative.   Neurological: Positive for headaches. Negative for dizziness and weakness.  All other systems reviewed and are negative.  Past Medical History  Diagnosis Date  . Lupus (HCC)   . Vaginal Pap smear, abnormal     Social History   Social History  . Marital Status: Married    Spouse Name: N/A  . Number of Children: N/A  . Years of Education: N/A   Occupational History  . Not on file.   Social History Main Topics  . Smoking status: Never Smoker   . Smokeless tobacco: Never Used  . Alcohol Use: No  . Drug Use: No  . Sexual Activity: Yes   Other Topics Concern  . Not on file   Social History Narrative    Past Surgical History  Procedure Laterality Date  . Bartholin gland cyst excision    . Cesarean section N/A 05/12/2014    Procedure: Primary Cesarean Section Delivery Baby Girl @ 2353, Apgars;  Surgeon: Lavina Hamman, MD;  Location: [facility];  Service: Obstetrics;  Laterality: N/A;    Family History  Problem Relation Age of Onset  . Lupus Maternal Aunt   . Breast cancer Paternal Aunt   . Lupus Maternal Grandmother   . Diabetes Paternal Grandmother     No Known Allergies  No current outpatient prescriptions on file prior to  visit.   No current facility-administered medications on file prior to visit.    BP 110/72 mmHg  Temp(Src) 98.1 F (36.7 C) (Oral)  Ht 5\' 2"  (1.575 m)  Wt 158 lb 1.6 oz (71.714 kg)  BMI 28.91 kg/m2       Objective:   Physical Exam  Constitutional: She is oriented to person, place, and time. She appears well-developed and well-nourished. No distress.  HENT:  Right Ear: Hearing, tympanic membrane, external ear and ear canal normal. Tympanic membrane is not retracted and not bulging.  Left Ear: Hearing, tympanic membrane, external ear and ear canal normal. Tympanic membrane is not retracted.  Nose: Mucosal edema and rhinorrhea present. No epistaxis. Right sinus exhibits maxillary sinus tenderness and frontal sinus tenderness. Left sinus exhibits maxillary sinus tenderness and frontal sinus tenderness.  Cardiovascular: Normal rate, regular rhythm, normal heart sounds and intact distal pulses.  Exam reveals no friction rub.   No murmur heard. Pulmonary/Chest: Effort normal and breath sounds normal. No respiratory distress. She has no wheezes. She has no rales. She exhibits no tenderness.  Neurological: She is alert and oriented to person, place, and time.  Skin: Skin is warm and dry. No rash noted. She is not diaphoretic. No erythema. No pallor.  Psychiatric: She has a normal mood and affect. Her behavior is normal. Judgment and thought content normal.  Nursing note  and vitals reviewed.     Assessment & Plan:  1. Seasonal allergies - Zyrtec-D - Flonase - Take every day - Follow up as needed  [name], NP

## 2016-02-23 NOTE — Patient Instructions (Addendum)
It was great seeing you again.   Pick up Zyrtec-D and Flonase, use as directed every day.   It was can up to 2 weeks to really get results from the medication.   Follow up as needed  Allergic Rhinitis Allergic rhinitis is when the mucous membranes in the nose respond to allergens. Allergens are particles in the air that cause your body to have an allergic reaction. This causes you to release allergic antibodies. Through a chain of events, these eventually cause you to release histamine into the blood stream. Although meant to protect the body, it is this release of histamine that causes your discomfort, such as frequent sneezing, congestion, and an itchy, runny nose.  CAUSES Seasonal allergic rhinitis (hay fever) is caused by pollen allergens that may come from grasses, trees, and weeds. Year-round allergic rhinitis (perennial allergic rhinitis) is caused by allergens such as house dust mites, pet dander, and mold spores. SYMPTOMS  Nasal stuffiness (congestion).  Itchy, runny nose with sneezing and tearing of the eyes. DIAGNOSIS Your health care provider can help you determine the allergen or allergens that trigger your symptoms. If you and your health care provider are unable to determine the allergen, skin or blood testing may be used. Your health care provider will diagnose your condition after taking your health history and performing a physical exam. Your health care provider may assess you for other related conditions, such as asthma, pink eye, or an ear infection. TREATMENT Allergic rhinitis does not have a cure, but it can be controlled by:  Medicines that block allergy symptoms. These may include allergy shots, nasal sprays, and oral antihistamines.  Avoiding the allergen. Hay fever may often be treated with antihistamines in pill or nasal spray forms. Antihistamines block the effects of histamine. There are over-the-counter medicines that may help with nasal congestion and swelling  around the eyes. Check with your health care provider before taking or giving this medicine. If avoiding the allergen or the medicine prescribed do not work, there are many new medicines your health care provider can prescribe. Stronger medicine may be used if initial measures are ineffective. Desensitizing injections can be used if medicine and avoidance does not work. Desensitization is when a patient is given ongoing shots until the body becomes less sensitive to the allergen. Make sure you follow up with your health care provider if problems continue. HOME CARE INSTRUCTIONS It is not possible to completely avoid allergens, but you can reduce your symptoms by taking steps to limit your exposure to them. It helps to know exactly what you are allergic to so that you can avoid your specific triggers. SEEK MEDICAL CARE IF:  You have a fever.  You develop a cough that does not stop easily (persistent).  You have shortness of breath.  You start wheezing.  Symptoms interfere with normal daily activities.   This information is not intended to replace advice given to you by your health care provider. Make sure you discuss any questions you have with your health care provider.   Document Released: 07/16/2001 Document Revised: 11/11/2014 Document Reviewed: 06/28/2013 Elsevier Interactive Patient Education [employer]

## 2016-11-27 ENCOUNTER — Encounter: Payer: Self-pay | Admitting: Adult Health

## 2016-11-27 ENCOUNTER — Ambulatory Visit (INDEPENDENT_AMBULATORY_CARE_PROVIDER_SITE_OTHER): Payer: BLUE CROSS/BLUE SHIELD | Admitting: Adult Health

## 2016-11-27 VITALS — BP 128/68 | Temp 99.3°F | Ht 62.0 in | Wt 155.0 lb

## 2016-11-27 DIAGNOSIS — J0111 Acute recurrent frontal sinusitis: Secondary | ICD-10-CM

## 2016-11-27 DIAGNOSIS — R6889 Other general symptoms and signs: Secondary | ICD-10-CM | POA: Diagnosis not present

## 2016-11-27 LAB — POCT INFLUENZA A/B
INFLUENZA A, POC: NEGATIVE
Influenza B, POC: NEGATIVE

## 2016-11-27 MED ORDER — DOXYCYCLINE HYCLATE 100 MG PO CAPS: 100.0000 mg | ORAL_CAPSULE | Freq: Two times a day (BID) | ORAL | 0 refills | Status: DC

## 2016-11-27 NOTE — Progress Notes (Signed)
Subjective:    Patient ID: Sheila Bates, female    DOB: 1986-10-14, 31 y.o.   MRN: [id number]  HPI  31 year old female who presents to the office today with flu like symptoms. She reports fever up to 101 ( yesterday), sore throat, semi productive cough, sinus pain/pressure, chest congestion, generalized body aches, fatigue, SOB, and dizziness. Her symptoms have been present for 4 days.   She has been using tylenol and mucinex without relief.   She denies any n/v/d  Review of Systems See HPI  Past Medical History:  Diagnosis Date  . Lupus   . Vaginal Pap smear, abnormal     Social History   Social History  . Marital status: Married    Spouse name: N/A  . Number of children: N/A  . Years of education: N/A   Occupational History  . Not on file.   Social History Main Topics  . Smoking status: Never Smoker  . Smokeless tobacco: Never Used  . Alcohol use No  . Drug use: No  . Sexual activity: Yes   Other Topics Concern  . Not on file   Social History Narrative  . No narrative on file    Past Surgical History:  Procedure Laterality Date  . BARTHOLIN GLAND CYST EXCISION    . CESAREAN SECTION N/A 05/12/2014   Procedure: Primary Cesarean Section Delivery Baby Girl @ 2353, Apgars;  Surgeon: Lavina Hamman, MD;  Location: [facility];  Service: Obstetrics;  Laterality: N/A;    Family History  Problem Relation Age of Onset  . Lupus Maternal Aunt   . Breast cancer Paternal Aunt   . Lupus Maternal Grandmother   . Diabetes Paternal Grandmother     No Known Allergies  Current Outpatient Prescriptions on File Prior to Visit  Medication Sig Dispense Refill  . norelgestromin-ethinyl estradiol (ORTHO EVRA) 150-35 MCG/24HR transdermal patch Place 1 patch onto the skin once a week.     No current facility-administered medications on file prior to visit.     BP 128/68   Temp 99.3 F (37.4 C) (Oral)   Ht 5\' 2"  (1.575 m)   Wt 155 lb (70.3 kg)   BMI 28.35 kg/m         Objective:   Physical Exam  Constitutional: She is oriented to person, place, and time. She appears well-developed and well-nourished. No distress.  HENT:  Head: Normocephalic and atraumatic.  Right Ear: Tympanic membrane, external ear and ear canal normal.  Left Ear: External ear normal.  Nose: Mucosal edema and rhinorrhea present. Right sinus exhibits frontal sinus tenderness. Right sinus exhibits no maxillary sinus tenderness. Left sinus exhibits frontal sinus tenderness. Left sinus exhibits no maxillary sinus tenderness.  Mouth/Throat: Uvula is midline and mucous membranes are normal. Oropharyngeal exudate present.  Cardiovascular: Normal rate, regular rhythm, normal heart sounds and intact distal pulses.  Exam reveals no gallop and no friction rub.   No murmur heard. Pulmonary/Chest: Effort normal and breath sounds normal. No respiratory distress. She has no wheezes. She has no rales. She exhibits no tenderness.  Neurological: She is alert and oriented to person, place, and time.  Skin: Skin is warm and dry. No rash noted. She is not diaphoretic. No erythema. No pallor.  Psychiatric: She has a normal mood and affect. Her behavior is normal. Judgment and thought content normal.  Nursing note and vitals reviewed.      Assessment & Plan:  1. Flu-like symptoms - POC Influenza A/B- Negative  2. Acute recurrent frontal sinusitis - doxycycline (VIBRAMYCIN) 100 MG capsule; Take 1 capsule (100 mg total) by mouth 2 (two) times daily.  Dispense: 14 capsule; Refill: 0 - Stay hydrated and rest - Follow up if no improvement in the next 2-3 days   [name], NP

## 2016-11-27 NOTE — Patient Instructions (Signed)
It was great seeing you today!  Your symptoms are consistent with a sinus infection.   I have sent in a prescription for Doxycycline. Take this twice a day for 7 days   Also use Floanse   General Recommendations:    Please drink plenty of fluids.  Get plenty of rest   Sleep in humidified air  Use saline nasal sprays  Netti pot   OTC Medications:  Decongestants - helps relieve congestion   Flonase (generic fluticasone) or Nasacort (generic triamcinolone) - please make sure to use the "cross-over" technique at a 45 degree angle towards the opposite eye as opposed to straight up the nasal passageway.   Sudafed (generic pseudoephedrine - Note this is the one that is available behind the pharmacy counter); Products with phenylephrine (-PE) may also be used but is often not as effective as pseudoephedrine.   If you have HIGH BLOOD PRESSURE - Coricidin HBP; AVOID any product that is -D as this contains pseudoephedrine which may increase your blood pressure.  Afrin (oxymetazoline) every 6-8 hours for up to 3 days.   Allergies - helps relieve runny nose, itchy eyes and sneezing   Claritin (generic loratidine), Allegra (fexofenidine), or Zyrtec (generic cyrterizine) for runny nose. These medications should not cause drowsiness.  Note - Benadryl (generic diphenhydramine) may be used however may cause drowsiness  Cough -   Delsym or Robitussin (generic dextromethorphan)  Expectorants - helps loosen mucus to ease removal   Mucinex (generic guaifenesin) as directed on the package.  Headaches / General Aches   Tylenol (generic acetaminophen) - DO NOT EXCEED 3 grams (3,000 mg) in a 24 hour time period  Advil/Motrin (generic ibuprofen)   Sore Throat -   Salt water gargle   Chloraseptic (generic benzocaine) spray or lozenges / Sucrets (generic dyclonine)    Sinusitis Sinusitis is redness, soreness, and inflammation of the paranasal sinuses. Paranasal sinuses are air  pockets within the bones of your face (beneath the eyes, the middle of the forehead, or above the eyes). In healthy paranasal sinuses, mucus is able to drain out, and air is able to circulate through them by way of your nose. However, when your paranasal sinuses are inflamed, mucus and air can become trapped. This can allow bacteria and other germs to grow and cause infection. Sinusitis can develop quickly and last only a short time (acute) or continue over a long period (chronic). Sinusitis that lasts for more than 12 weeks is considered chronic.  CAUSES  Causes of sinusitis include:  Allergies.  Structural abnormalities, such as displacement of the cartilage that separates your nostrils (deviated septum), which can decrease the air flow through your nose and sinuses and affect sinus drainage.  Functional abnormalities, such as when the small hairs (cilia) that line your sinuses and help remove mucus do not work properly or are not present. SIGNS AND SYMPTOMS  Symptoms of acute and chronic sinusitis are the same. The primary symptoms are pain and pressure around the affected sinuses. Other symptoms include:  Upper toothache.  Earache.  Headache.  Bad breath.  Decreased sense of smell and taste.  A cough, which worsens when you are lying flat.  Fatigue.  Fever.  Thick drainage from your nose, which often is green and may contain pus (purulent).  Swelling and warmth over the affected sinuses. DIAGNOSIS  Your health care provider will perform a physical exam. During the exam, your health care provider may:  Look in your nose for signs of abnormal growths  in your nostrils (nasal polyps).  Tap over the affected sinus to check for signs of infection.  View the inside of your sinuses (endoscopy) using an imaging device that has a light attached (endoscope). If your health care provider suspects that you have chronic sinusitis, one or more of the following tests may be  recommended:  Allergy tests.  Nasal culture. A sample of mucus is taken from your nose, sent to a lab, and screened for bacteria.  Nasal cytology. A sample of mucus is taken from your nose and examined by your health care provider to determine if your sinusitis is related to an allergy. TREATMENT  Most cases of acute sinusitis are related to a viral infection and will resolve on their own within 10 days. Sometimes medicines are prescribed to help relieve symptoms (pain medicine, decongestants, nasal steroid sprays, or saline sprays).  However, for sinusitis related to a bacterial infection, your health care provider will prescribe antibiotic medicines. These are medicines that will help kill the bacteria causing the infection.  Rarely, sinusitis is caused by a fungal infection. In theses cases, your health care provider will prescribe antifungal medicine. For some cases of chronic sinusitis, surgery is needed. Generally, these are cases in which sinusitis recurs more than 3 times per year, despite other treatments. HOME CARE INSTRUCTIONS   Drink plenty of water. Water helps thin the mucus so your sinuses can drain more easily.  Use a humidifier.  Inhale steam 3 to 4 times a day (for example, sit in the bathroom with the shower running).  Apply a warm, moist washcloth to your face 3 to 4 times a day, or as directed by your health care provider.  Use saline nasal sprays to help moisten and clean your sinuses.  Take medicines only as directed by your health care provider.  If you were prescribed either an antibiotic or antifungal medicine, finish it all even if you start to feel better. SEEK IMMEDIATE MEDICAL CARE IF:  You have increasing pain or severe headaches.  You have nausea, vomiting, or drowsiness.  You have swelling around your face.  You have vision problems.  You have a stiff neck.  You have difficulty breathing. MAKE SURE YOU:   Understand these  instructions.  Will watch your condition.  Will get help right away if you are not doing well or get worse. Document Released: 10/21/2005 Document Revised: 03/07/2014 Document Reviewed: 11/05/2011 [facility] Patient Information 2015 [place], [place]. This information is not intended to replace advice given to you by your health care provider. Make sure you discuss any questions you have with your health care provider.

## 2017-10-08 LAB — OB RESULTS CONSOLE GC/CHLAMYDIA
Chlamydia: NEGATIVE
GC PROBE AMP, GENITAL: NEGATIVE

## 2017-10-08 LAB — OB RESULTS CONSOLE HIV ANTIBODY (ROUTINE TESTING): HIV: NONREACTIVE

## 2017-10-08 LAB — OB RESULTS CONSOLE RPR: RPR: NONREACTIVE

## 2017-10-08 LAB — HIV ANTIBODY (ROUTINE TESTING W REFLEX): HIV 1&2 Ab, 4th Generation: NEGATIVE

## 2017-10-08 LAB — OB RESULTS CONSOLE HEPATITIS B SURFACE ANTIGEN: Hepatitis B Surface Ag: NEGATIVE

## 2017-10-08 LAB — OB RESULTS CONSOLE RUBELLA ANTIBODY, IGM: Rubella: IMMUNE

## 2017-10-08 LAB — OB RESULTS CONSOLE ABO/RH: RH TYPE: POSITIVE

## 2017-10-08 LAB — OB RESULTS CONSOLE ANTIBODY SCREEN: ANTIBODY SCREEN: NEGATIVE

## 2018-04-13 ENCOUNTER — Encounter (HOSPITAL_COMMUNITY): Payer: Self-pay | Admitting: *Deleted

## 2018-04-16 ENCOUNTER — Inpatient Hospital Stay (HOSPITAL_COMMUNITY)
Admission: AD | Admit: 2018-04-16 | Discharge: 2018-04-16 | Disposition: A | Discharge status: Home / Self Care | Payer: BLUE CROSS/BLUE SHIELD | Source: Ambulatory Visit | Attending: Obstetrics and Gynecology | Admitting: Obstetrics and Gynecology

## 2018-04-16 ENCOUNTER — Encounter (HOSPITAL_COMMUNITY): Payer: Self-pay | Admitting: *Deleted

## 2018-04-16 DIAGNOSIS — O26893 Other specified pregnancy related conditions, third trimester: Secondary | ICD-10-CM

## 2018-04-16 DIAGNOSIS — R109 Unspecified abdominal pain: Secondary | ICD-10-CM | POA: Diagnosis present

## 2018-04-16 DIAGNOSIS — Z79899 Other long term (current) drug therapy: Secondary | ICD-10-CM | POA: Diagnosis not present

## 2018-04-16 DIAGNOSIS — O471 False labor at or after 37 completed weeks of gestation: Secondary | ICD-10-CM | POA: Diagnosis not present

## 2018-04-16 DIAGNOSIS — Z3A37 37 weeks gestation of pregnancy: Secondary | ICD-10-CM | POA: Diagnosis not present

## 2018-04-16 DIAGNOSIS — N898 Other specified noninflammatory disorders of vagina: Secondary | ICD-10-CM

## 2018-04-16 NOTE — Discharge Instructions (Signed)

## 2018-04-16 NOTE — MAU Provider Note (Signed)
Chief Complaint:  Rupture of Membranes    HPI: Sheila Bates is a 32 y.o. [id number] at 29w2dwho presents to maternity admissions reporting Leaking of fluid since 1845. Slow trickling and wetness on clothing. She reports good fetal movement, denies LOF, vaginal bleeding, vaginal itching/burning, urinary symptoms, h/a, dizziness, n/v, or fever/chills.    Abdominal Pain  This is a new problem. The current episode started today. The problem occurs intermittently. The problem has been unchanged. The quality of the pain is cramping. The abdominal pain does not radiate. Pertinent negatives include no constipation, diarrhea, fever, headaches, myalgias, nausea or vomiting. Associated symptoms comments: Leaking fluid . Nothing aggravates the pain. The pain is relieved by nothing. She has tried nothing for the symptoms.  Vaginal Discharge  The patient's primary symptoms include pelvic pain and vaginal discharge. The patient's pertinent negatives include no genital itching, genital lesions, genital odor or vaginal bleeding. This is a new problem. The current episode started today. The problem occurs intermittently. The pain is mild. Associated symptoms include abdominal pain. Pertinent negatives include no constipation, diarrhea, fever, headaches, nausea or vomiting. The vaginal discharge was clear and thin. There has been no bleeding.   RN Note: Pt here with c/o leaking of fluid that started about 1845. Denies any bleeding. Having some cramping. Reports good fetal movement    Past Medical History: Past Medical History:  Diagnosis Date  . 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 Rheumatoid arthritis (HCC)    mild affecting hands  . Vaginal Pap smear, abnormal     Past obstetric history: OB History  Gravida Para Term Preterm AB Living  4 1 1  0 2 1  SAB TAB Ectopic Multiple Live Births  1 1 0 0 1    # Outcome Date GA Lbr Len/2nd Weight Sex Delivery Anes PTL Lv  4 Current           3 Term  05/12/14 [redacted]w[redacted]d -22:15 / 02:53 6 lb 5.2 oz (2.87 kg) F CS-LTranv EPI  LIV  2 SAB           1 TAB             Past Surgical History: Past Surgical History:  Procedure Laterality Date  . BARTHOLIN GLAND CYST EXCISION    . CESAREAN SECTION N/A 05/12/2014   Procedure: Primary Cesarean Section Delivery Baby Girl @ 2353, Apgars;  Surgeon: 07/13/2014, MD;  Location: [facility];  Service: Obstetrics;  Laterality: N/A;  . I&D EXTREMITY     inguinal draining during pregnancy  . MYRINGOTOMY    . WISDOM TOOTH EXTRACTION      Family History: Family History  Problem Relation Age of Onset  . Hypertension Father   . Diabetes Father   . Lupus Maternal Aunt   . Breast cancer Paternal Aunt   . Lupus Maternal Grandmother   . Diabetes Paternal Grandmother     Social History: Social History   Tobacco Use  . Smoking status: Never Smoker  . Smokeless tobacco: Never Used  Substance Use Topics  . Alcohol use: No  . Drug use: No    Allergies: No Known Allergies  Meds:  Medications Prior to Admission  Medication Sig Dispense Refill Last Dose  . ferrous sulfate 325 (65 FE) MG tablet Take 325 mg by mouth every other day.   04/16/2018 at Unknown time  . hydroxychloroquine (PLAQUENIL) 200 MG tablet Take 400 mg by mouth.   04/16/2018 at Unknown time  .  Prenatal Vit-Fe Fumarate-FA (PRENATAL MULTIVITAMIN) TABS tablet Take 1 tablet by mouth daily at 12 noon.   04/16/2018 at Unknown time  . doxycycline (VIBRAMYCIN) 100 MG capsule Take 1 capsule (100 mg total) by mouth 2 (two) times daily. 14 capsule 0   . norelgestromin-ethinyl estradiol (ORTHO EVRA) 150-35 MCG/24HR transdermal patch Place 1 patch onto the skin once a week.   Taking    ROS:  Review of Systems  Constitutional: Negative for fever.  Gastrointestinal: Positive for abdominal pain. Negative for constipation, diarrhea, nausea and vomiting.  Genitourinary: Positive for pelvic pain and vaginal discharge.  Musculoskeletal: Negative for myalgias.   Neurological: Negative for headaches.     I have reviewed patient's Past Medical Hx, Surgical Hx, Family Hx, Social Hx, medications and allergies.   Physical Exam   Patient Vitals for the past 24 hrs:  BP Temp Temp src Pulse Resp SpO2 Height Weight  04/16/18 2222 117/73 98.2 F (36.8 C) Oral 90 18 - - -  04/16/18 2143 127/76 98.7 F (37.1 C) Oral 93 18 99 % 5\' 3"  (1.6 m) 188 lb (85.3 kg)   Constitutional: Well-developed, well-nourished female in no acute distress.  Cardiovascular: normal rate and rhythm Respiratory: normal effort, no distress GI: Abd soft, non-tender, gravid appropriate for gestational age.  MS: Extremities nontender, no edema, normal ROM Neurologic: Alert and oriented x 4.  GU: Neg CVAT.  PELVIC EXAM: Cervix pink, visually closed, without lesion, scant white creamy discharge, vaginal walls and external genitalia normal   No Pooling, NO ferning  Dilation: 1 Effacement (%): Thick Station: [place], -3 Presentation: Vertex Exam by:: [email or web address] CNM   FHT:  Baseline 140 , moderate variability, accelerations present, no decelerations Contractions: Irregular   Labs: O/Positive/-- (12/05 0000) No results found for this or any previous visit (from the past 24 hour(s)).  Imaging:  No results found.  MAU Course/MDM: I have Done a speculum exam and fern test which was Negative.  Pooling was negative Nitrazine not done.   Consult Dr 06-01-1983 by RN.   Pt stable at time of discharge.  Assessment: 1. False labor after 37 completed weeks of gestation     Plan: Discharge home Labor precautions and fetal kick counts    Jackelyn Knife CNM, MSN Certified Nurse-Midwife 04/16/2018 10:26 PM

## 2018-04-16 NOTE — MAU Note (Signed)
Pt here with c/o leaking of fluid that started about 1845. Denies any bleeding. Having some cramping. Reports good fetal movement.

## 2018-04-27 ENCOUNTER — Encounter (HOSPITAL_COMMUNITY): Payer: Self-pay

## 2018-04-27 ENCOUNTER — Encounter (HOSPITAL_COMMUNITY)
Admission: RE | Admit: 2018-04-27 | Discharge: 2018-04-27 | Disposition: A | Discharge status: Home / Self Care | Payer: BLUE CROSS/BLUE SHIELD | Source: Ambulatory Visit | Attending: Obstetrics and Gynecology | Admitting: Obstetrics and Gynecology

## 2018-04-27 HISTORY — DX: Rheumatoid arthritis, unspecified: M06.9

## 2018-04-27 LAB — TYPE AND SCREEN
ABO/RH(D): O POS
Antibody Screen: NEGATIVE

## 2018-04-27 LAB — CBC
HEMATOCRIT: 35.4 % — AB (ref 36.0–46.0)
Hemoglobin: 11.4 g/dL — ABNORMAL LOW (ref 12.0–15.0)
MCH: 29.7 pg (ref 26.0–34.0)
MCHC: 32.2 g/dL (ref 30.0–36.0)
MCV: 92.2 fL (ref 78.0–100.0)
Platelets: 163 10*3/uL (ref 150–400)
RBC: 3.84 MIL/uL — AB (ref 3.87–5.11)
RDW: 13.3 % (ref 11.5–15.5)
WBC: 5.8 10*3/uL (ref 4.0–10.5)

## 2018-04-27 NOTE — H&P (Signed)
Sheila Bates is a 32 y.o. female [id number] at 46 0/7 weeks (EDD 05/05/18 by LMP c/w 10 week [place])  presenting for scheduled repeat c-section. Prenatal care complicated by previous c-section for arrest of descent, pt declines TOL.   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  She was followed by pediatric cardiology with fetal ECHO q 2 weeks from 18-28 weeks which showed no heart block but did have a prominent azygous vein, thought to be a normal variant but recommend postpartum fetal ECHO and neonatal EKG.  OB History    Gravida  4   Para  1   Term  1   Preterm  0   AB  2   Living  1     SAB  1   TAB  1   Ectopic  0   Multiple  0   Live Births  1         35 LTCS 6#5oz  SAB x 1 EAB x 1  Past Medical History:  Diagnosis Date  . 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 Rheumatoid arthritis (HCC)    mild affecting hands  . Vaginal Pap smear, abnormal    Past Surgical History:  Procedure Laterality Date  . BARTHOLIN GLAND CYST EXCISION    . CESAREAN SECTION N/A 05/12/2014   Procedure: Primary Cesarean Section Delivery Baby Girl @ 2353, Apgars;  Surgeon: Lavina Hamman, MD;  Location: [facility];  Service: Obstetrics;  Laterality: N/A;  . I&D EXTREMITY     inguinal draining during pregnancy  . MYRINGOTOMY    . WISDOM TOOTH EXTRACTION     Family History: family history includes Breast cancer in her paternal aunt; Diabetes in her father and paternal grandmother; Hypertension in her father; Lupus in her maternal aunt and maternal grandmother. Social History:  reports that she has never smoked. She has never used smokeless tobacco. She reports that she does not drink alcohol or use drugs.     Maternal Diabetes: No Genetic Screening: Normal Maternal Ultrasounds/Referrals: Normal Fetal Ultrasounds or other Referrals:  Fetal echo--prominent azygous vein, needs pp ECHO to f/u and neonatal  EKG Maternal Substance Abuse:  No Significant Maternal Medications:   hydroxychoroquine Significant Maternal Lab Results:  None Other Comments:  None  Review of Systems  Eyes: Negative for blurred vision.  Gastrointestinal: Negative for abdominal pain.  Neurological: Negative for tremors.   Maternal Medical History:  Contractions: Frequency: rare.   Perceived severity is mild.    Fetal activity: Perceived fetal activity is normal.    Prenatal complications: Lupus with anti ro antibodies  Prenatal Complications - Diabetes: none.      Last menstrual period 07/29/2017. Maternal Exam:  Uterine Assessment: Contraction strength is mild.  Contraction frequency is rare.   Abdomen: Patient reports no abdominal tenderness. Surgical scars: low transverse.   Introitus: Normal vulva. Normal vagina.    Physical Exam  Constitutional: She appears well-developed.  Cardiovascular: Normal rate and regular rhythm.  Respiratory: Effort normal.  GI: Soft.  Genitourinary: Vagina normal.  Musculoskeletal: Normal range of motion.  Neurological: She is alert.  Psychiatric: She has a normal mood and affect.    Prenatal labs: ABO, Rh: --/--/O POS (06/24 1108) Antibody: NEG (06/24 1108) Rubella: Immune (12/05 0000) RPR: Nonreactive (12/05 0000)  HBsAg: Negative (12/05 0000)  HIV: Non-reactive (12/05 0000)  GBS:   negative Essential panel negative First trimester screen and AFP  negative One hour GCT 154 3 hour GTT WNL  Assessment/Plan: Pt counseled on risks and benefits of repeat c-section including bleeding, infection and possible damage to bowel a,d bladder.  Pt desires to proceed.     Oliver Pila 04/27/2018, 5:07 PM

## 2018-04-27 NOTE — Patient Instructions (Signed)
Sheila Bates  04/27/2018   Your procedure is scheduled on:  04/28/2018  Enter through the Main Entrance of [facility] at 0530 AM.  Pick up the phone at the desk and dial [phone]  Call this number if you have problems the morning of surgery:[phone]  Remember:   Do not eat food:(After Midnight) Desps de medianoche.  Do not drink clear liquids: (After Midnight) Desps de medianoche.  Take these medicines the morning of surgery with A SIP OF WATER: none   Do not wear jewelry, make-up or nail polish.  Do not wear lotions, powders, or perfumes. Do not wear deodorant.  Do not shave 48 hours prior to surgery.  Do not bring valuables to the hospital.  [facility] is not   responsible for any belongings or valuables brought to the hospital.  Contacts, dentures or bridgework may not be worn into surgery.  Leave suitcase in the car. After surgery it may be brought to your room.  For patients admitted to the hospital, checkout time is 11:00 AM the day of              discharge.    N/A   Please read over the following fact sheets that you were given:   Surgical Site Infection Prevention

## 2018-04-28 ENCOUNTER — Other Ambulatory Visit: Payer: Self-pay

## 2018-04-28 ENCOUNTER — Inpatient Hospital Stay (HOSPITAL_COMMUNITY): Payer: BLUE CROSS/BLUE SHIELD | Admitting: Anesthesiology

## 2018-04-28 ENCOUNTER — Encounter (HOSPITAL_COMMUNITY)
Admission: RE | Disposition: A | Discharge status: Home / Self Care | Payer: Self-pay | Source: Home / Self Care | Attending: Obstetrics and Gynecology

## 2018-04-28 ENCOUNTER — Encounter (HOSPITAL_COMMUNITY): Payer: Self-pay | Admitting: *Deleted

## 2018-04-28 ENCOUNTER — Inpatient Hospital Stay (HOSPITAL_COMMUNITY)
Admission: RE | Admit: 2018-04-28 | Discharge: 2018-04-30 | DRG: 788 | Disposition: A | Discharge status: Home / Self Care | Payer: BLUE CROSS/BLUE SHIELD | Attending: Obstetrics and Gynecology | Admitting: Obstetrics and Gynecology

## 2018-04-28 ENCOUNTER — Inpatient Hospital Stay (HOSPITAL_COMMUNITY)
Admission: AD | Admit: 2018-04-28 | Payer: BLUE CROSS/BLUE SHIELD | Source: Ambulatory Visit | Admitting: Obstetrics and Gynecology

## 2018-04-28 DIAGNOSIS — Z3A39 39 weeks gestation of pregnancy: Secondary | ICD-10-CM

## 2018-04-28 DIAGNOSIS — O34219 Maternal care for unspecified type scar from previous cesarean delivery: Secondary | ICD-10-CM

## 2018-04-28 DIAGNOSIS — M069 Rheumatoid arthritis, unspecified: Secondary | ICD-10-CM | POA: Diagnosis present

## 2018-04-28 DIAGNOSIS — O26893 Other specified pregnancy related conditions, third trimester: Secondary | ICD-10-CM | POA: Diagnosis present

## 2018-04-28 DIAGNOSIS — O34211 Maternal care for low transverse scar from previous cesarean delivery: Principal | ICD-10-CM | POA: Diagnosis present

## 2018-04-28 DIAGNOSIS — Z98891 History of uterine scar from previous surgery: Secondary | ICD-10-CM

## 2018-04-28 LAB — RPR: RPR Ser Ql: NONREACTIVE

## 2018-04-28 SURGERY — Surgical Case
Anesthesia: Spinal

## 2018-04-28 MED ORDER — KETOROLAC TROMETHAMINE 30 MG/ML IJ SOLN
INTRAMUSCULAR | Status: AC
  Filled 2018-04-28: qty 1

## 2018-04-28 MED ORDER — PRENATAL MULTIVITAMIN CH
1.0000 | ORAL_TABLET | Freq: Every day | ORAL | Status: DC
  Administered 2018-04-29 – 2018-04-30 (×2): 1 via ORAL
  Filled 2018-04-28 (×3): qty 1

## 2018-04-28 MED ORDER — SIMETHICONE 80 MG PO CHEW: 80.0000 mg | CHEWABLE_TABLET | ORAL | Status: DC | PRN

## 2018-04-28 MED ORDER — WITCH HAZEL-GLYCERIN EX PADS: 1.0000 "application " | MEDICATED_PAD | CUTANEOUS | Status: DC | PRN

## 2018-04-28 MED ORDER — ZOLPIDEM TARTRATE 5 MG PO TABS: 5.0000 mg | ORAL_TABLET | Freq: Every evening | ORAL | Status: DC | PRN

## 2018-04-28 MED ORDER — LACTATED RINGERS IV SOLN: INTRAVENOUS | Status: DC

## 2018-04-28 MED ORDER — OXYTOCIN 40 UNITS IN LACTATED RINGERS INFUSION - SIMPLE MED: INTRAVENOUS | Status: DC | PRN

## 2018-04-28 MED ORDER — BUPIVACAINE IN DEXTROSE 0.75-8.25 % IT SOLN
INTRATHECAL | Status: DC | PRN
  Administered 2018-04-28: 12 mL via INTRATHECAL

## 2018-04-28 MED ORDER — OXYCODONE HCL 5 MG PO TABS
5.0000 mg | ORAL_TABLET | ORAL | Status: DC | PRN
  Administered 2018-04-29 (×3): 5 mg via ORAL
  Filled 2018-04-28 (×3): qty 1

## 2018-04-28 MED ORDER — NALBUPHINE HCL 10 MG/ML IJ SOLN: 5.0000 mg | Freq: Once | INTRAMUSCULAR | Status: DC | PRN

## 2018-04-28 MED ORDER — CEFAZOLIN SODIUM-DEXTROSE 2-4 GM/100ML-% IV SOLN
2.0000 g | INTRAVENOUS | Status: AC
  Administered 2018-04-28: 2 g via INTRAVENOUS

## 2018-04-28 MED ORDER — COCONUT OIL OIL
1.0000 "application " | TOPICAL_OIL | Status: DC | PRN
  Administered 2018-04-29: 1 via TOPICAL
  Filled 2018-04-28: qty 120

## 2018-04-28 MED ORDER — NALOXONE HCL 0.4 MG/ML IJ SOLN: 0.4000 mg | INTRAMUSCULAR | Status: DC | PRN

## 2018-04-28 MED ORDER — FENTANYL CITRATE (PF) 100 MCG/2ML IJ SOLN
INTRAMUSCULAR | Status: DC | PRN
  Administered 2018-04-28: 10 ug via INTRATHECAL

## 2018-04-28 MED ORDER — OXYTOCIN 10 UNIT/ML IJ SOLN
INTRAVENOUS | Status: DC | PRN
  Administered 2018-04-28: 40 [IU] via INTRAVENOUS

## 2018-04-28 MED ORDER — SIMETHICONE 80 MG PO CHEW
80.0000 mg | CHEWABLE_TABLET | ORAL | Status: DC
  Administered 2018-04-28 – 2018-04-29 (×2): 80 mg via ORAL
  Filled 2018-04-28 (×2): qty 1

## 2018-04-28 MED ORDER — MEPERIDINE HCL 25 MG/ML IJ SOLN: 6.2500 mg | INTRAMUSCULAR | Status: DC | PRN

## 2018-04-28 MED ORDER — KETOROLAC TROMETHAMINE 30 MG/ML IJ SOLN: 30.0000 mg | Freq: Four times a day (QID) | INTRAMUSCULAR | Status: AC | PRN

## 2018-04-28 MED ORDER — MORPHINE SULFATE (PF) 0.5 MG/ML IJ SOLN
INTRAMUSCULAR | Status: DC | PRN
  Administered 2018-04-28: .2 mg via INTRATHECAL

## 2018-04-28 MED ORDER — DIPHENHYDRAMINE HCL 50 MG/ML IJ SOLN: 12.5000 mg | INTRAMUSCULAR | Status: DC | PRN

## 2018-04-28 MED ORDER — SCOPOLAMINE 1 MG/3DAYS TD PT72
MEDICATED_PATCH | TRANSDERMAL | Status: AC
  Filled 2018-04-28: qty 1

## 2018-04-28 MED ORDER — ACETAMINOPHEN 325 MG PO TABS
650.0000 mg | ORAL_TABLET | ORAL | Status: DC | PRN
  Administered 2018-04-29 – 2018-04-30 (×3): 650 mg via ORAL
  Filled 2018-04-28 (×4): qty 2

## 2018-04-28 MED ORDER — HYDROXYCHLOROQUINE SULFATE 200 MG PO TABS
400.0000 mg | ORAL_TABLET | Freq: Every day | ORAL | Status: DC
  Administered 2018-04-29 – 2018-04-30 (×2): 400 mg via ORAL
  Filled 2018-04-28 (×3): qty 2

## 2018-04-28 MED ORDER — GLYCOPYRROLATE 0.2 MG/ML IJ SOLN
INTRAMUSCULAR | Status: AC
  Filled 2018-04-28: qty 1

## 2018-04-28 MED ORDER — ACETAMINOPHEN 500 MG PO TABS
1000.0000 mg | ORAL_TABLET | Freq: Four times a day (QID) | ORAL | Status: AC
  Administered 2018-04-28 (×2): 1000 mg via ORAL
  Filled 2018-04-28 (×2): qty 2

## 2018-04-28 MED ORDER — ONDANSETRON HCL 4 MG/2ML IJ SOLN: 4.0000 mg | Freq: Three times a day (TID) | INTRAMUSCULAR | Status: DC | PRN

## 2018-04-28 MED ORDER — DIPHENHYDRAMINE HCL 25 MG PO CAPS: 25.0000 mg | ORAL_CAPSULE | Freq: Four times a day (QID) | ORAL | Status: DC | PRN

## 2018-04-28 MED ORDER — PHENYLEPHRINE 8 MG IN D5W 100 ML (0.08MG/ML) PREMIX OPTIME
INJECTION | INTRAVENOUS | Status: DC | PRN
  Administered 2018-04-28: 60 ug/min via INTRAVENOUS

## 2018-04-28 MED ORDER — DIPHENHYDRAMINE HCL 25 MG PO CAPS
25.0000 mg | ORAL_CAPSULE | ORAL | Status: DC | PRN
  Filled 2018-04-28: qty 1

## 2018-04-28 MED ORDER — DEXAMETHASONE SODIUM PHOSPHATE 4 MG/ML IJ SOLN
INTRAMUSCULAR | Status: AC
  Filled 2018-04-28: qty 1

## 2018-04-28 MED ORDER — IBUPROFEN 600 MG PO TABS
600.0000 mg | ORAL_TABLET | Freq: Four times a day (QID) | ORAL | Status: DC
  Administered 2018-04-28 – 2018-04-30 (×8): 600 mg via ORAL
  Filled 2018-04-28 (×8): qty 1

## 2018-04-28 MED ORDER — NALBUPHINE HCL 10 MG/ML IJ SOLN: 5.0000 mg | INTRAMUSCULAR | Status: DC | PRN

## 2018-04-28 MED ORDER — DEXAMETHASONE SODIUM PHOSPHATE 4 MG/ML IJ SOLN
INTRAMUSCULAR | Status: DC | PRN
  Administered 2018-04-28: 4 mg via INTRAVENOUS

## 2018-04-28 MED ORDER — SODIUM CHLORIDE 0.9% FLUSH: 3.0000 mL | INTRAVENOUS | Status: DC | PRN

## 2018-04-28 MED ORDER — ONDANSETRON HCL 4 MG/2ML IJ SOLN
INTRAMUSCULAR | Status: AC
  Filled 2018-04-28: qty 2

## 2018-04-28 MED ORDER — SENNOSIDES-DOCUSATE SODIUM 8.6-50 MG PO TABS
2.0000 | ORAL_TABLET | ORAL | Status: DC
  Administered 2018-04-28 – 2018-04-29 (×2): 2 via ORAL
  Filled 2018-04-28 (×2): qty 2

## 2018-04-28 MED ORDER — MENTHOL 3 MG MT LOZG: 1.0000 | LOZENGE | OROMUCOSAL | Status: DC | PRN

## 2018-04-28 MED ORDER — OXYTOCIN 10 UNIT/ML IJ SOLN
INTRAMUSCULAR | Status: AC
  Filled 2018-04-28: qty 4

## 2018-04-28 MED ORDER — PHENYLEPHRINE 8 MG IN D5W 100 ML (0.08MG/ML) PREMIX OPTIME
INJECTION | INTRAVENOUS | Status: AC
  Filled 2018-04-28: qty 100

## 2018-04-28 MED ORDER — OXYTOCIN 40 UNITS IN LACTATED RINGERS INFUSION - SIMPLE MED: 2.5000 [IU]/h | INTRAVENOUS | Status: AC

## 2018-04-28 MED ORDER — LACTATED RINGERS IV SOLN
INTRAVENOUS | Status: DC
  Administered 2018-04-28 (×2): via INTRAVENOUS

## 2018-04-28 MED ORDER — SCOPOLAMINE 1 MG/3DAYS TD PT72
1.0000 | MEDICATED_PATCH | Freq: Once | TRANSDERMAL | Status: DC
  Administered 2018-04-28: 1.5 mg via TRANSDERMAL

## 2018-04-28 MED ORDER — OXYCODONE HCL 5 MG PO TABS: 10.0000 mg | ORAL_TABLET | ORAL | Status: DC | PRN

## 2018-04-28 MED ORDER — DIBUCAINE 1 % RE OINT: 1.0000 "application " | TOPICAL_OINTMENT | RECTAL | Status: DC | PRN

## 2018-04-28 MED ORDER — SIMETHICONE 80 MG PO CHEW
80.0000 mg | CHEWABLE_TABLET | Freq: Three times a day (TID) | ORAL | Status: DC
  Administered 2018-04-28 – 2018-04-30 (×4): 80 mg via ORAL
  Filled 2018-04-28 (×4): qty 1

## 2018-04-28 MED ORDER — KETOROLAC TROMETHAMINE 30 MG/ML IJ SOLN
30.0000 mg | Freq: Four times a day (QID) | INTRAMUSCULAR | Status: AC | PRN
  Administered 2018-04-28: 30 mg via INTRAMUSCULAR

## 2018-04-28 MED ORDER — METOCLOPRAMIDE HCL 5 MG/ML IJ SOLN: 10.0000 mg | Freq: Once | INTRAMUSCULAR | Status: DC | PRN

## 2018-04-28 MED ORDER — FENTANYL CITRATE (PF) 100 MCG/2ML IJ SOLN
INTRAMUSCULAR | Status: AC
  Filled 2018-04-28: qty 2

## 2018-04-28 MED ORDER — TETANUS-DIPHTH-ACELL PERTUSSIS 5-2.5-18.5 LF-MCG/0.5 IM SUSP: 0.5000 mL | Freq: Once | INTRAMUSCULAR | Status: DC

## 2018-04-28 MED ORDER — NALOXONE HCL 4 MG/10ML IJ SOLN
1.0000 ug/kg/h | INTRAVENOUS | Status: DC | PRN
  Filled 2018-04-28: qty 5

## 2018-04-28 MED ORDER — OXYTOCIN 40 UNITS IN LACTATED RINGERS INFUSION - SIMPLE MED
INTRAVENOUS | Status: DC | PRN
  Administered 2018-04-28: 600 mL via INTRAVENOUS
  Administered 2018-04-28: 100 mL via INTRAVENOUS

## 2018-04-28 MED ORDER — SODIUM CHLORIDE 0.9 % IR SOLN
Status: DC | PRN
  Administered 2018-04-28: 1

## 2018-04-28 MED ORDER — FENTANYL CITRATE (PF) 100 MCG/2ML IJ SOLN: 25.0000 ug | INTRAMUSCULAR | Status: DC | PRN

## 2018-04-28 MED ORDER — GLYCOPYRROLATE 0.2 MG/ML IJ SOLN
INTRAMUSCULAR | Status: DC | PRN
  Administered 2018-04-28: 0.2 mg via INTRAVENOUS

## 2018-04-28 MED ORDER — MORPHINE SULFATE (PF) 0.5 MG/ML IJ SOLN
INTRAMUSCULAR | Status: AC
  Filled 2018-04-28: qty 10

## 2018-04-28 MED ORDER — ONDANSETRON HCL 4 MG/2ML IJ SOLN
INTRAMUSCULAR | Status: DC | PRN
  Administered 2018-04-28: 4 mg via INTRAVENOUS

## 2018-04-28 SURGICAL SUPPLY — 41 items
BENZOIN TINCTURE PRP APPL 2/3 (GAUZE/BANDAGES/DRESSINGS) ×3 IMPLANT
CHLORAPREP W/TINT 26ML (MISCELLANEOUS) ×3 IMPLANT
CLAMP CORD UMBIL (MISCELLANEOUS) IMPLANT
CLOSURE STERI STRIP 1/2 X4 (GAUZE/BANDAGES/DRESSINGS) ×3 IMPLANT
CLOSURE WOUND 1/2 X4 (GAUZE/BANDAGES/DRESSINGS)
CLOTH BEACON ORANGE TIMEOUT ST (SAFETY) ×3 IMPLANT
DRSG OPSITE POSTOP 4X10 (GAUZE/BANDAGES/DRESSINGS) ×3 IMPLANT
ELECT REM PT RETURN 9FT ADLT (ELECTROSURGICAL) ×3
ELECTRODE REM PT RTRN 9FT ADLT (ELECTROSURGICAL) ×1 IMPLANT
EXTRACTOR VACUUM KIWI (MISCELLANEOUS) IMPLANT
GLOVE BIO SURGEON STRL SZ 6.5 (GLOVE) ×2 IMPLANT
GLOVE BIO SURGEONS STRL SZ 6.5 (GLOVE) ×1
GLOVE BIOGEL PI IND STRL 7.0 (GLOVE) ×1 IMPLANT
GLOVE BIOGEL PI INDICATOR 7.0 (GLOVE) ×2
GOWN STRL REUS W/TWL LRG LVL3 (GOWN DISPOSABLE) ×6 IMPLANT
KIT ABG SYR 3ML LUER SLIP (SYRINGE) IMPLANT
NEEDLE HYPO 25X5/8 SAFETYGLIDE (NEEDLE) IMPLANT
NS IRRIG 1000ML POUR BTL (IV SOLUTION) ×3 IMPLANT
PACK C SECTION WH (CUSTOM PROCEDURE TRAY) ×3 IMPLANT
PAD OB MATERNITY 4.3X12.25 (PERSONAL CARE ITEMS) ×3 IMPLANT
PENCIL SMOKE EVAC W/HOLSTER (ELECTROSURGICAL) ×3 IMPLANT
RTRCTR C-SECT PINK 25CM LRG (MISCELLANEOUS) ×3 IMPLANT
SPONGE LAP 18X18 RF (DISPOSABLE) ×3 IMPLANT
STRIP CLOSURE SKIN 1/2X4 (GAUZE/BANDAGES/DRESSINGS) IMPLANT
SUT CHROMIC 1 CTX 36 (SUTURE) ×6 IMPLANT
SUT PLAIN 0 NONE (SUTURE) IMPLANT
SUT PLAIN 2 0 (SUTURE) ×2
SUT PLAIN 2 0 XLH (SUTURE) ×3 IMPLANT
SUT PLAIN ABS 2-0 CT1 27XMFL (SUTURE) ×1 IMPLANT
SUT VIC AB 0 CT1 27 (SUTURE) ×4
SUT VIC AB 0 CT1 27XBRD ANBCTR (SUTURE) ×2 IMPLANT
SUT VIC AB 2-0 CT1 (SUTURE) ×6 IMPLANT
SUT VIC AB 2-0 CT1 27 (SUTURE) ×2
SUT VIC AB 2-0 CT1 TAPERPNT 27 (SUTURE) ×1 IMPLANT
SUT VIC AB 3-0 CT1 27 (SUTURE)
SUT VIC AB 3-0 CT1 TAPERPNT 27 (SUTURE) IMPLANT
SUT VIC AB 3-0 SH 27 (SUTURE) ×2
SUT VIC AB 3-0 SH 27X BRD (SUTURE) ×1 IMPLANT
SUT VIC AB 4-0 KS 27 (SUTURE) ×3 IMPLANT
TOWEL OR 17X24 6PK STRL BLUE (TOWEL DISPOSABLE) ×3 IMPLANT
TRAY FOLEY W/BAG SLVR 14FR LF (SET/KITS/TRAYS/PACK) ×3 IMPLANT

## 2018-04-28 NOTE — Anesthesia Preprocedure Evaluation (Signed)
Anesthesia Evaluation  Patient identified by MRN, date of birth, ID band Patient awake    Reviewed: Allergy & Precautions, NPO status , Patient's Chart, lab work & pertinent test results  Airway Mallampati: II  TM Distance: >3 FB Neck ROM: Full    Dental no notable dental hx.    Pulmonary neg pulmonary ROS,    Pulmonary exam normal breath sounds clear to auscultation       Cardiovascular negative cardio ROS Normal cardiovascular exam Rhythm:Regular Rate:Normal     Neuro/Psych negative neurological ROS  negative psych ROS   GI/Hepatic negative GI ROS, Neg liver ROS,   Endo/Other  negative endocrine ROS  Renal/GU negative Renal ROS  negative genitourinary   Musculoskeletal negative musculoskeletal ROS (+)   Abdominal   Peds negative pediatric ROS (+)  Hematology negative hematology ROS (+)   Anesthesia Other Findings   Reproductive/Obstetrics (+) Pregnancy                             Anesthesia Physical Anesthesia Plan  ASA: II  Anesthesia Plan: Spinal   Post-op Pain Management:    Induction:   PONV Risk Score and Plan: Ondansetron and Scopolamine patch - Pre-op  Airway Management Planned: Natural Airway  Additional Equipment:   Intra-op Plan:   Post-operative Plan:   Informed Consent: I have reviewed the patients History and Physical, chart, labs and discussed the procedure including the risks, benefits and alternatives for the proposed anesthesia with the patient or authorized representative who has indicated his/her understanding and acceptance.   Dental advisory given  Plan Discussed with:   Anesthesia Plan Comments:         Anesthesia Quick Evaluation

## 2018-04-28 NOTE — Transfer of Care (Signed)
Immediate Anesthesia Transfer of Care Note  Patient: Sheila Bates  Procedure(s) Performed: REPEAT CESAREAN SECTION (N/A )  Patient Location: PACU  Anesthesia Type:Spinal  Level of Consciousness: awake, alert  and oriented  Airway & Oxygen Therapy: Patient Spontanous Breathing  Post-op Assessment: Report given to RN and Post -op Vital signs reviewed and stable  Post vital signs: Reviewed and stable  Last Vitals:  Vitals Value Taken Time  BP 107/76 04/28/2018  9:06 AM  Temp    Pulse 71 04/28/2018  9:07 AM  Resp    SpO2 100 % 04/28/2018  9:07 AM  Vitals shown include unvalidated device data.  Last Pain:  Vitals:   04/28/18 0605  TempSrc:   PainSc: 0-No pain         Complications: No apparent anesthesia complications

## 2018-04-28 NOTE — Progress Notes (Signed)
Patient ID: Sheila Bates, female   DOB: 09-Jul-1986, 32 y.o.   MRN: [id number] Per patient no changes in dictated H&P. Brief exam WNL.  Ready to proceed.

## 2018-04-28 NOTE — Anesthesia Procedure Notes (Signed)
Spinal  Patient location during procedure: OR Start time: 04/28/2018 7:29 AM End time: 04/28/2018 7:31 AM Staffing Anesthesiologist: Phillips Grout, MD Performed: anesthesiologist  Preanesthetic Checklist Completed: patient identified, site marked, surgical consent, pre-op evaluation, timeout performed, IV checked, risks and benefits discussed and monitors and equipment checked Spinal Block Patient position: sitting Prep: DuraPrep Patient monitoring: heart rate, cardiac monitor, continuous pulse ox and blood pressure Approach: midline Location: L3-4 Injection technique: single-shot Needle Needle type: Sprotte  Needle gauge: 24 G Needle length: 9 cm Assessment Sensory level: T6

## 2018-04-28 NOTE — Op Note (Signed)
Operative Note    Preoperative Diagnosis Term pregnancy at [redacted] weeks gestation Prior c-section, declines TOL  Postoperative Diagnosis Same with lysis of adhesions  Procedure Repeat low transverse c-section with two layer closure of uterus  Surgeon Huel Cote, MD  Anesthesia Spinal  Fluids: EBL UOP clear IVF LR  Findings A viable female infant in the vertex position. Apgars 8,9 weight pending.  Uterus with a thick adhesive band from right fundus to anterior abdominal wall, slightly distorting position of uterus, taken down. Ovaries and tubes WNL.  Specimen Placenta to L&D  Procedure Note Patient was taken to the operating room where spinal anesthesia was obtained and found to be adequate by Allis clamp test. She was prepped and draped in the normal sterile fashion in the dorsal supine position with a leftward tilt. An appropriate time out was performed. A Pfannenstiel skin incision was then made through a pre-existing scar with the scalpel and carried through to the underlying layer of fascia by sharp dissection and Bovie cautery. The fascia was nicked in the midline and the incision was extended laterally with Mayo scissors. The inferior aspect of the incision was grasped Coker clamps and dissected off the underlying rectus muscles. In a similar fashion the superior aspect was dissected off the rectus muscles. Rectus muscles were separated in the midline and the peritoneal cavity entered bluntly. The peritoneal incision was then extended both superiorly and inferiorly with careful attention to avoid both bowel and bladder. It was noted that there was a thick adhesive band from the anterior right fundus to the anterior abdominal wall, but as this was away from the lower uterine segmanet, we proceeded with delivery of the baby before addressing it. The Alexis self-retaining wound retractor was then placed within the incision and the lower uterine segment exposed.  The bladder flap was developed with Metzenbaum scissors and pushed away from the lower uterine segment. The lower uterine segment was then incised in a transverse fashion and the cavity itself entered bluntly. The incision was extended bluntly. The infant's head was then lifted and delivered from the incision without difficulty. The remainder of the infant delivered and the nose and mouth bulb suctioned with the cord clamped and cut as well. The infant was handed off to the waiting pediatricians. The placenta was then spontaneously expressed from the uterus and the uterus cleared of all clots and debris with moist lap sponge. The uterine incision was then repaired in 2 layers the first layer was a running locked layer of 1-0 chromic and the second an imbricating layer of the same suture.  The tubes and ovaries were inspected and the gutters cleared of all clots and debris. The uterine incision required several figure-of-eight sutures along the incision then was noted to be hemostatic.   All instruments and sponges as well as the Alexis retractor were then removed from the abdomen.  The adhesion was then taken down with Bovie cautery to restore normal anatomy and the pedicle sutured with 2-0 vicryl for hemostasis.  The rectus muscles and peritoneum were then reapproximated with a running suture of 2-0 Vicryl. The fascia was then closed with 0 Vicryl in a running fashion. Subcutaneous tissue was reapproximated with 3-0 plain in a running fashion. The skin was closed with a subcuticular stitch of 4-0 Vicryl on a Keith needle and then reinforced with benzoin and Steri-Strips. At the conclusion of the procedure all instruments and sponge counts were correct. Patient was taken to the recovery room  in good condition with her baby accompanying her skin to skin.

## 2018-04-29 LAB — CBC
HEMATOCRIT: 28.1 % — AB (ref 36.0–46.0)
HEMOGLOBIN: 9.5 g/dL — AB (ref 12.0–15.0)
MCH: 30.7 pg (ref 26.0–34.0)
MCHC: 33.8 g/dL (ref 30.0–36.0)
MCV: 90.9 fL (ref 78.0–100.0)
Platelets: 145 10*3/uL — ABNORMAL LOW (ref 150–400)
RBC: 3.09 MIL/uL — ABNORMAL LOW (ref 3.87–5.11)
RDW: 13.1 % (ref 11.5–15.5)
WBC: 10.5 10*3/uL (ref 4.0–10.5)

## 2018-04-29 NOTE — Progress Notes (Signed)
Patient told the plan of care. Patient told to ambulate halls . Patient told about all pain meds she can have. Patient told to keep up with yellow sheet today and to call for any questions or concerns.

## 2018-04-29 NOTE — Progress Notes (Deleted)
Patient told about pain meds she can have. Patient taught hand expression. Patient told to call out for any quesitons or concerns. Patient told the plan of care. Patient was banded with infant's red numbers. Upon assessment patient did NOT have ID bands on for red numbers. Patient banded with ID number [id number]. Safety Zone will be done. Numbers matched with infant. Mom told about how often to feed infant upon cues and at least 8 times in a day. All questions answered.

## 2018-04-29 NOTE — Plan of Care (Signed)
Progressing appropriately. Encouraged to walk in hall way. Encouraged to call for assistance as needed, and for [facility] assessment.

## 2018-04-29 NOTE — Lactation Note (Signed)
This note was copied from a baby's chart. Lactation Consultation Note  Patient Name: Sheila Bates [id number] Date: 04/29/2018 Reason for consult: Initial assessment;Term  P2 mother whose infant is now 72 hours old.  Mother breastfed her first child.  Baby swaddled and sleeping in bassinet when I arrived.  Mother requesting latch assistance the next time he feeds.  I suggested she call me when he shows feeding cues so I may assist.  Reviewed feeding cues.  Encouraged to feed 8-12 times/24 hours or more if he shows cues, STS, breast massage and hand expression.  Mom made aware of O/P services, breastfeeding support groups, community resources, and our phone # for post-discharge questions.      Maternal Data Formula Feeding for Exclusion: No Has patient been taught Hand Expression?: Yes Does the patient have breastfeeding experience prior to this delivery?: Yes  Feeding    LATCH Score                   Interventions    Lactation Tools Discussed/Used Tools: Shells   Consult Status Consult Status: Follow-up Date: 04/30/18 Follow-up type: In-patient    Taiz Bickle R Adi Seales 04/29/2018, 2:10 AM

## 2018-04-29 NOTE — Lactation Note (Signed)
This note was copied from a baby's chart. Lactation Consultation Note  Patient Name: Sheila Bates [id number] Date: 04/29/2018 Reason for consult: Follow-up assessment;Difficult latch Mom recently pumped 4 mls of colostrum.  Assisted with positioning baby in football hold on right side.  Baby unable to grasp tissue so a 20 mm nipple shield used.  Nipple shield filled with colostrum pumped.  Baby latched easily and well to breast. Baby actively sucking with a few swallows noted.  Mom using good breast massage/compression during feeding.  Discussed cluster feeding.  Instructed to feed with cues and post pump every 3 hours.  Encouraged to call for assist/concerns prn.  Maternal Data    Feeding Feeding Type: Breast Fed  LATCH Score Latch: Grasps breast easily, tongue down, lips flanged, rhythmical sucking.(with 20 mm nipple shield)  Audible Swallowing: A few with stimulation  Type of Nipple: Everted at rest and after stimulation  Comfort (Breast/Nipple): Soft / non-tender  Hold (Positioning): Assistance needed to correctly position infant at breast and maintain latch.  LATCH Score: 8  Interventions Interventions: Assisted with latch;Breast compression;Shells;Skin to skin;Adjust position;Breast massage;Support pillows  Lactation Tools Discussed/Used     Consult Status Consult Status: Follow-up Date: 04/30/18 Follow-up type: In-patient    Huston Foley 04/29/2018, 1:23 PM

## 2018-04-29 NOTE — Progress Notes (Signed)
Subjective: Postpartum Day 1: Cesarean Delivery Patient reports incisional pain and tolerating PO.  Nl lochia, pain controlled  Objective: Vital signs in last 24 hours: Temp:  [97.3 F (36.3 C)-98.4 F (36.9 C)] 97.8 F (36.6 C) (06/26 0528) Pulse Rate:  [64-80] 67 (06/26 0528) Resp:  [14-20] 18 (06/25 2156) BP: (107-130)/(68-84) 112/68 (06/26 0528) SpO2:  [97 %-100 %] 97 % (06/26 0528)  Physical Exam:  General: alert and no distress Lochia: appropriate Uterine Fundus: firm Incision: healing well DVT Evaluation: No evidence of DVT seen on physical exam.  Recent Labs    04/27/18 1108 04/29/18 0533  HGB 11.4* 9.5*  HCT 35.4* 28.1*    Assessment/Plan: Status post Cesarean section. Doing well postoperatively.  Continue current care. D/w mother circumcision for female infant including r/b/a - will try to proceed today.  Broly Hatfield Bovard-Stuckert 04/29/2018, 7:18 AM

## 2018-04-29 NOTE — Progress Notes (Signed)
Patient states she is not passing gas. Positive bowel sounds abdomen soft. Patient told to drink fluids and walk halls. Patient told about using her incentive spirometer. It looked like she had notbeen using it. Patient did not want me to do PKU or heart screen until this afternoon. Circumcision care explained.

## 2018-04-29 NOTE — Lactation Note (Signed)
This note was copied from a baby's chart. Lactation Consultation Note  Patient Name: Sheila Bates [id number] Date: 04/29/2018 Reason for consult: Follow-up assessment;Difficult latch;Term  P2 mother whose infant is now 59 hours old.  Mother requested latch assistance.  Mother's breasts are soft and non tender with very short shafted nipples bilaterally.  Mother wanted to attempt latching without her NS this time.  Attempted to latch in the football hold on the right breast without success.  Infant could not latch onto the breast and became frustrated.  Mother has a #24 NS at bedside.  After trying him with this size I was not satisfied with the latch and exchanged the #24 for a #20.  Baby latched onto the #20 with ease.  Lips were flanged and his suck was strong.  Mother felt a tug but no pain.  Reviewed feeding 8-12 times/24 hours or more if he shows feeding cues.  Mother will post pump after feeding and also do hand expression.  Pump parts, assembly, disassembly and cleaning of parts reviewed.  #24 flange appropriate at this time.  Mother has no further questions/concerns and will call as needed for assistance.  Father at bedside. RN updated.   Maternal Data Formula Feeding for Exclusion: No Has patient been taught Hand Expression?: Yes Does the patient have breastfeeding experience prior to this delivery?: Yes  Feeding Feeding Type: Breast Fed Length of feed: 15 min(still feeding when I left the room)  LATCH Score Latch: Grasps breast easily, tongue down, lips flanged, rhythmical sucking.  Audible Swallowing: None  Type of Nipple: Everted at rest and after stimulation  Comfort (Breast/Nipple): Soft / non-tender  Hold (Positioning): Assistance needed to correctly position infant at breast and maintain latch.  LATCH Score: 7  Interventions Interventions: Breast feeding basics reviewed;Assisted with latch;Skin to skin;Breast massage;Hand express;Position options;Support  pillows;Adjust position;Breast compression;Shells;DEBP  Lactation Tools Discussed/Used Tools: Shells Shell Type: Inverted Pump Review: Setup, frequency, and cleaning;Milk Storage Initiated by:: Jaryn Rosko Date initiated:: 04/30/18   Consult Status Consult Status: Follow-up Date: 04/30/18 Follow-up type: In-patient    Violett Hobbs R Eeshan Verbrugge 04/29/2018, 4:13 AM

## 2018-04-30 MED ORDER — OXYCODONE HCL 5 MG PO TABS: 5.0000 mg | ORAL_TABLET | ORAL | 0 refills | Status: AC | PRN

## 2018-04-30 MED ORDER — IBUPROFEN 600 MG PO TABS: 600.0000 mg | ORAL_TABLET | Freq: Four times a day (QID) | ORAL | 1 refills | Status: DC | PRN

## 2018-04-30 NOTE — Discharge Summary (Signed)
OB Discharge Summary     Patient Name: Sheila Bates DOB: 1986-05-14 MRN: [id number]  Date of admission: 04/28/2018 Delivering MD: Huel Cote   Date of discharge: 04/30/2018  Admitting diagnosis: repeat c-section Intrauterine pregnancy: [redacted]w[redacted]d     Secondary diagnosis:  Active Problems:   History of cesarean section complicating pregnancy   Status post repeat low transverse cesarean section  Additional problems: none     Discharge diagnosis: Term Pregnancy Delivered                                                                                                Post partum procedures:none  Augmentation:N/a  Complications: None  Hospital course:  Sceduled C/S   32 y.o. yo [id number] at [redacted]w[redacted]d was admitted to the hospital 04/28/2018 for scheduled cesarean section with the following indication:Elective Repeat.  Membrane Rupture Time/Date: 7:57 AM ,04/28/2018   Patient delivered a Viable infant.04/28/2018  Details of operation can be found in separate operative note.  Pateint had an uncomplicated postpartum course.  She is ambulating, tolerating a regular diet, passing flatus, and urinating well. Patient is discharged home in stable condition on  04/30/18         Physical exam  Vitals:   04/29/18 1025 04/29/18 1426 04/29/18 2155 04/30/18 0524  BP: 110/72 119/69 117/68 130/80  Pulse: 85 77 86 98  Resp: 18 18 18 17   Temp: 98.2 F (36.8 C) 97.7 F (36.5 C) 98.6 F (37 C) 98.7 F (37.1 C)  TempSrc: Axillary Oral Oral   SpO2: 100%  99%   Weight:      Height:       General: alert, cooperative and no distress Lochia: appropriate Uterine Fundus: firm Incision: Dressing is clean, dry, and intact DVT Evaluation: No evidence of DVT seen on physical exam. Negative Homan's sign. Labs: Lab Results  Component Value Date   WBC 10.5 04/29/2018   HGB 9.5 (L) 04/29/2018   HCT 28.1 (L) 04/29/2018   MCV 90.9 04/29/2018   PLT 145 (L) 04/29/2018   CMP Latest Ref Rng &  Units 08/04/2015  Glucose 70 - 99 mg/dL 87  BUN 6 - 23 mg/dL 12  Creatinine 08/06/2015 - 0.10 mg/dL 0.71  Sodium 2.19 - 758 mEq/L 140  Potassium 3.5 - 5.1 mEq/L 4.1  Chloride 96 - 112 mEq/L 106  CO2 19 - 32 mEq/L 28  Calcium 8.4 - 10.5 mg/dL 9.3  Total Protein 6.0 - 8.3 g/dL 7.6  Total Bilirubin 0.2 - 1.2 mg/dL 0.5  Alkaline Phos 39 - 117 U/L 56  AST 0 - 37 U/L 18  ALT 0 - 35 U/L 9    Discharge instruction: per After Visit Summary and "Baby and Me Booklet".  After visit meds:  Allergies as of 04/30/2018   No Known Allergies     Medication List    TAKE these medications   calcium carbonate 500 MG chewable tablet Commonly known as:  TUMS - dosed in mg elemental calcium Chew 2 tablets by mouth daily as needed for indigestion or heartburn.   ferrous sulfate 325 (65 FE) MG  tablet Take 325 mg by mouth every other day.   hydroxychloroquine 200 MG tablet Commonly known as:  PLAQUENIL Take 400 mg by mouth daily.   ibuprofen 600 MG tablet Commonly known as:  ADVIL,MOTRIN Take 1 tablet (600 mg total) by mouth every 6 (six) hours as needed.   oxyCODONE 5 MG immediate release tablet Commonly known as:  Oxy IR/ROXICODONE Take 1 tablet (5 mg total) by mouth every 4 (four) hours as needed for up to 7 days for severe pain (pain scale 4-7).   prenatal multivitamin Tabs tablet Take 1 tablet by mouth daily.       Diet: routine diet  Activity: Advance as tolerated. Pelvic rest for 6 weeks.   Outpatient follow up:2 weeks Follow up Appt:No future appointments. Follow up Visit:No follow-ups on file.  Postpartum contraception: Not Discussed  Newborn Data: Live born female  Birth Weight: 7 lb 1.1 oz (3205 g) APGAR: 9, 9  Newborn Delivery   Birth date/time:  04/28/2018 07:58:00 Delivery type:  C-Section, Low Transverse Trial of labor:  No C-section categorization:  Repeat     Baby Feeding: Breast Disposition:home with mother   04/30/2018 Cathrine Muster, DO

## 2018-04-30 NOTE — Discharge Instructions (Signed)
Nothing in vagina for 6 weeks.  No sex, tampons, and douching.  Other instructions as in [facility] Discharge Booklet. °

## 2018-04-30 NOTE — Progress Notes (Signed)
Patient ID: Sheila Bates, female   DOB: [birth year], 32 y.o.   MRN: [id number] Pt doing well. Pain controlled with meds. Ambulating with no issues, voiding well, passing flatus. Denies fever, chills, CP or SOB. Had heartburn earlier today. Requests discharge to home. Bonding well with baby.  VSS ABD - dressing c/d/i; FF EXT - no edema or homans  A/P: POD# 2 s/p repeat c/s - stable         Discharge instructions reviewed         2 week incision check and 6 week postpartum visit advised

## 2018-04-30 NOTE — Anesthesia Postprocedure Evaluation (Signed)
Anesthesia Post Note  Patient: Sheila Bates  Procedure(s) Performed: REPEAT CESAREAN SECTION (N/A )     Patient location during evaluation: PACU Anesthesia Type: Spinal Level of consciousness: awake and alert Pain management: pain level controlled Vital Signs Assessment: post-procedure vital signs reviewed and stable Respiratory status: spontaneous breathing and respiratory function stable Cardiovascular status: blood pressure returned to baseline and stable Postop Assessment: no headache, no backache, spinal receding and no apparent nausea or vomiting Anesthetic complications: no    Last Vitals:  Vitals:   04/29/18 2155 04/30/18 0524  BP: 117/68 130/80  Pulse: 86 98  Resp: 18 17  Temp: 37 C 37.1 C  SpO2: 99%     Last Pain:  Vitals:   04/30/18 0848  TempSrc:   PainSc: 3    Pain Goal:                 Phillips Grout

## 2018-05-05 ENCOUNTER — Encounter: Payer: Self-pay | Admitting: Family Medicine

## 2018-12-17 ENCOUNTER — Ambulatory Visit: Payer: Self-pay | Admitting: Adult Health

## 2018-12-17 ENCOUNTER — Ambulatory Visit: Payer: Self-pay

## 2018-12-17 NOTE — Telephone Encounter (Signed)
Noted  

## 2018-12-17 NOTE — Telephone Encounter (Signed)
Pt. Reports she has had dizziness x 1 week, and seems to be getting worse. When she looks up it seem to make it worse. Having "some ringing in my ears." Eating and drinking well. No vertigo. No other symptoms other than "a little nausea." Appointment made for today.Instructed if symptoms worsen, to call back.  Reason for Disposition . [1] MODERATE dizziness (e.g., interferes with normal activities) AND [2] has NOT been evaluated by physician for this  (Exception: dizziness caused by heat exposure, sudden standing, or poor fluid intake)  Answer Assessment - Initial Assessment Questions 1. DESCRIPTION: "Describe your dizziness."     Lightheaded 2. LIGHTHEADED: "Do you feel lightheaded?" (e.g., somewhat faint, woozy, weak upon standing)     Woozy 3. VERTIGO: "Do you feel like either you or the room is spinning or tilting?" (i.e. vertigo)     No 4. SEVERITY: "How bad is it?"  "Do you feel like you are going to faint?" "Can you stand and walk?"   - MILD - walking normally   - MODERATE - interferes with normal activities (e.g., work, school)    - SEVERE - unable to stand, requires support to walk, feels like passing out now.      Mild 5. ONSET:  "When did the dizziness begin?"     1 week ago 6. AGGRAVATING FACTORS: "Does anything make it worse?" (e.g., standing, change in head position)     Changing head position 7. HEART RATE: "Can you tell me your heart rate?" "How many beats in 15 seconds?"  (Note: not all patients can do this)       No 8. CAUSE: "What do you think is causing the dizziness?"     Unsure 9. RECURRENT SYMPTOM: "Have you had dizziness before?" If so, ask: "When was the last time?" "What happened that time?"     No 10. OTHER SYMPTOMS: "Do you have any other symptoms?" (e.g., fever, chest pain, vomiting, diarrhea, bleeding)       No 11. PREGNANCY: "Is there any chance you are pregnant?" "When was your last menstrual period?"       No  Protocols used: DIZZINESS [facility]

## 2018-12-18 ENCOUNTER — Ambulatory Visit: Payer: BC Managed Care – PPO | Admitting: Adult Health

## 2018-12-18 ENCOUNTER — Encounter: Payer: Self-pay | Admitting: Adult Health

## 2018-12-18 VITALS — BP 160/104 | Temp 98.8°F | Wt 169.0 lb

## 2018-12-18 DIAGNOSIS — R030 Elevated blood-pressure reading, without diagnosis of hypertension: Secondary | ICD-10-CM | POA: Diagnosis not present

## 2018-12-18 LAB — CBC WITH DIFFERENTIAL/PLATELET
BASOS ABS: 0 10*3/uL (ref 0.0–0.1)
Basophils Relative: 0.5 % (ref 0.0–3.0)
EOS ABS: 0.1 10*3/uL (ref 0.0–0.7)
Eosinophils Relative: 2.3 % (ref 0.0–5.0)
HEMATOCRIT: 40.9 % (ref 36.0–46.0)
HEMOGLOBIN: 13.2 g/dL (ref 12.0–15.0)
LYMPHS PCT: 36 % (ref 12.0–46.0)
Lymphs Abs: 1.5 10*3/uL (ref 0.7–4.0)
MCHC: 32.2 g/dL (ref 30.0–36.0)
MCV: 88.8 fl (ref 78.0–100.0)
Monocytes Absolute: 0.5 10*3/uL (ref 0.1–1.0)
Monocytes Relative: 12 % (ref 3.0–12.0)
Neutro Abs: 2 10*3/uL (ref 1.4–7.7)
Neutrophils Relative %: 49.2 % (ref 43.0–77.0)
Platelets: 221 10*3/uL (ref 150.0–400.0)
RBC: 4.6 Mil/uL (ref 3.87–5.11)
RDW: 12.8 % (ref 11.5–15.5)
WBC: 4.1 10*3/uL (ref 4.0–10.5)

## 2018-12-18 LAB — BASIC METABOLIC PANEL
BUN: 13 mg/dL (ref 6–23)
CHLORIDE: 104 meq/L (ref 96–112)
CO2: 26 mEq/L (ref 19–32)
Calcium: 9.5 mg/dL (ref 8.4–10.5)
Creatinine, Ser: 0.64 mg/dL (ref 0.40–1.20)
GFR: 127.75 mL/min (ref >60.00)
GLUCOSE: 90 mg/dL (ref 70–99)
POTASSIUM: 4 meq/L (ref 3.5–5.1)
Sodium: 140 mEq/L (ref 135–145)

## 2018-12-18 MED ORDER — AMLODIPINE BESYLATE 2.5 MG PO TABS: 2.5000 mg | ORAL_TABLET | Freq: Every day | ORAL | 0 refills | Status: DC

## 2018-12-18 NOTE — Progress Notes (Signed)
Subjective:    Patient ID: Sheila Bates, female    DOB: Jul 07, 1986, 33 y.o.   MRN: [id number]  HPI 33 year old female who  has a past medical history of Lupus (HCC), Rheumatoid arthritis (HCC), and Vaginal Pap smear, abnormal.   She was last sen in 11/2016.   She presents to the office today for the complaint of dizziness and headaches since having her baby in June 2019. She reports that during her pregnancy she had elevated blood pressure readings. Her symptoms have been present on a daily basis. She does not check her blood pressure at home.   Denies blurred vision or floaters   She is breast feeding   BP Readings from Last 3 Encounters:  04/30/18 130/80  04/16/18 117/73  11/27/16 128/68   Wt Readings from Last 10 Encounters:  12/18/18 169 lb (76.7 kg)  04/28/18 175 lb 4.8 oz (79.5 kg)  04/13/18 173 lb (78.5 kg)  04/16/18 188 lb (85.3 kg)  11/27/16 155 lb (70.3 kg)  02/23/16 158 lb 1.6 oz (71.7 kg)  08/04/15 154 lb 12.8 oz (70.2 kg)  06/02/15 152 lb 8 oz (69.2 kg)  04/10/15 154 lb (69.9 kg)  01/19/15 159 lb 6.4 oz (72.3 kg)     Review of Systems See HPI   Past Medical History:  Diagnosis Date  . 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 Rheumatoid arthritis (HCC)    mild affecting hands  . Vaginal Pap smear, abnormal     Social History   Socioeconomic History  . Marital status: Married    Spouse name: Not on file  . Number of children: Not on file  . Years of education: Not on file  . Highest education level: Not on file  Occupational History  . Not on file  Social Needs  . Financial resource strain: Not on file  . Food insecurity:    Worry: Not on file    Inability: Not on file  . Transportation needs:    Medical: Not on file    Non-medical: Not on file  Tobacco Use  . Smoking status: Never Smoker  . Smokeless tobacco: Never Used  Substance and Sexual Activity  . Alcohol use: No  . Drug use: No  . Sexual activity: Yes    Birth  control/protection: Other-see comments    Comment: visectomy   Lifestyle  . Physical activity:    Days per week: Not on file    Minutes per session: Not on file  . Stress: Not on file  Relationships  . Social connections:    Talks on phone: Not on file    Gets together: Not on file    Attends religious service: Not on file    Active member of club or organization: Not on file    Attends meetings of clubs or organizations: Not on file    Relationship status: Not on file  . Intimate partner violence:    Fear of current or ex partner: Not on file    Emotionally abused: Not on file    Physically abused: Not on file    Forced sexual activity: Not on file  Other Topics Concern  . Not on file  Social History Narrative  . Not on file    Past Surgical History:  Procedure Laterality Date  . BARTHOLIN GLAND CYST EXCISION    . CESAREAN SECTION N/A 05/12/2014   Procedure: Primary Cesarean Section Delivery Baby Girl @ 2353, Apgars;  Surgeon: Lavina Hamman, MD;  Location: [facility];  Service: Obstetrics;  Laterality: N/A;  . CESAREAN SECTION N/A 04/28/2018   Procedure: REPEAT CESAREAN SECTION;  Surgeon: Huel Cote, MD;  Location: [facility];  Service: Obstetrics;  Laterality: N/A;  MD request RNFA  . I&D EXTREMITY     inguinal draining during pregnancy  . MYRINGOTOMY    . WISDOM TOOTH EXTRACTION      Family History  Problem Relation Age of Onset  . Hypertension Father   . Diabetes Father   . Lupus Maternal Aunt   . Breast cancer Paternal Aunt   . Lupus Maternal Grandmother   . Diabetes Paternal Grandmother     No Known Allergies  Current Outpatient Medications on File Prior to Visit  Medication Sig Dispense Refill  . calcium carbonate (TUMS - DOSED IN MG ELEMENTAL CALCIUM) 500 MG chewable tablet Chew 2 tablets by mouth daily as needed for indigestion or heartburn.     No current facility-administered medications on file prior to visit.     BP (!) 160/104   Temp  98.8 F (37.1 C)   Wt 169 lb (76.7 kg)   BMI 29.94 kg/m       Objective:   Physical Exam Vitals signs and nursing note reviewed.  Constitutional:      Appearance: Normal appearance.  Eyes:     Extraocular Movements: Extraocular movements intact.     Pupils: Pupils are equal, round, and reactive to light.  Cardiovascular:     Rate and Rhythm: Normal rate and regular rhythm.     Pulses: Normal pulses.     Heart sounds: Normal heart sounds.  Pulmonary:     Effort: Pulmonary effort is normal.     Breath sounds: Normal breath sounds.  Neurological:     General: No focal deficit present.     Mental Status: She is alert.       Assessment & Plan:  1. Transient elevated blood pressure - Possibly due to weight gain from pregnancy. She does have a family history through. Will start on Norvasc 2.5 mg daily. She has a follow up appointment next Tuesday. Return precautions given - CBC with Differential/Platelet - Basic Metabolic Panel   [name], NP

## 2018-12-22 ENCOUNTER — Ambulatory Visit (INDEPENDENT_AMBULATORY_CARE_PROVIDER_SITE_OTHER): Payer: BC Managed Care – PPO | Admitting: Adult Health

## 2018-12-22 ENCOUNTER — Encounter: Payer: Self-pay | Admitting: Family Medicine

## 2018-12-22 ENCOUNTER — Encounter: Payer: Self-pay | Admitting: Adult Health

## 2018-12-22 VITALS — BP 108/74 | Temp 98.3°F | Ht 62.0 in | Wt 169.0 lb

## 2018-12-22 DIAGNOSIS — R5383 Other fatigue: Secondary | ICD-10-CM | POA: Diagnosis not present

## 2018-12-22 DIAGNOSIS — R030 Elevated blood-pressure reading, without diagnosis of hypertension: Secondary | ICD-10-CM | POA: Diagnosis not present

## 2018-12-22 DIAGNOSIS — E559 Vitamin D deficiency, unspecified: Secondary | ICD-10-CM

## 2018-12-22 DIAGNOSIS — R079 Chest pain, unspecified: Secondary | ICD-10-CM | POA: Diagnosis not present

## 2018-12-22 DIAGNOSIS — M329 Systemic lupus erythematosus, unspecified: Secondary | ICD-10-CM | POA: Diagnosis not present

## 2018-12-22 DIAGNOSIS — Z23 Encounter for immunization: Secondary | ICD-10-CM | POA: Diagnosis not present

## 2018-12-22 DIAGNOSIS — Z0000 Encounter for general adult medical examination without abnormal findings: Secondary | ICD-10-CM

## 2018-12-22 LAB — COMPREHENSIVE METABOLIC PANEL
ALBUMIN: 4.2 g/dL (ref 3.5–5.2)
ALK PHOS: 81 U/L (ref 39–117)
ALT: 8 U/L (ref 0–35)
AST: 12 U/L (ref 0–37)
BUN: 13 mg/dL (ref 6–23)
CALCIUM: 9.2 mg/dL (ref 8.4–10.5)
CO2: 28 mEq/L (ref 19–32)
Chloride: 103 mEq/L (ref 96–112)
Creatinine, Ser: 0.73 mg/dL (ref 0.40–1.20)
GFR: 109.74 mL/min (ref >60.00)
Glucose, Bld: 81 mg/dL (ref 70–99)
Potassium: 3.9 mEq/L (ref 3.5–5.1)
SODIUM: 138 meq/L (ref 135–145)
TOTAL PROTEIN: 6.9 g/dL (ref 6.0–8.3)
Total Bilirubin: 0.5 mg/dL (ref 0.2–1.2)

## 2018-12-22 LAB — CBC WITH DIFFERENTIAL/PLATELET
Basophils Absolute: 0 10*3/uL (ref 0.0–0.1)
Basophils Relative: 0.4 % (ref 0.0–3.0)
EOS ABS: 0 10*3/uL (ref 0.0–0.7)
EOS PCT: 0.6 % (ref 0.0–5.0)
HCT: 38.1 % (ref 36.0–46.0)
HEMOGLOBIN: 12.3 g/dL (ref 12.0–15.0)
LYMPHS ABS: 1.5 10*3/uL (ref 0.7–4.0)
Lymphocytes Relative: 36.4 % (ref 12.0–46.0)
MCHC: 32.4 g/dL (ref 30.0–36.0)
MCV: 88.2 fl (ref 78.0–100.0)
MONO ABS: 0.6 10*3/uL (ref 0.1–1.0)
Monocytes Relative: 14.4 % — ABNORMAL HIGH (ref 3.0–12.0)
Neutro Abs: 2 10*3/uL (ref 1.4–7.7)
Neutrophils Relative %: 48.2 % (ref 43.0–77.0)
Platelets: 225 10*3/uL (ref 150.0–400.0)
RBC: 4.32 Mil/uL (ref 3.87–5.11)
RDW: 12.8 % (ref 11.5–15.5)
WBC: 4.2 10*3/uL (ref 4.0–10.5)

## 2018-12-22 LAB — TSH: TSH: 0.85 u[IU]/mL (ref 0.35–4.50)

## 2018-12-22 LAB — LIPID PANEL
CHOLESTEROL: 148 mg/dL (ref 0–200)
HDL: 53.8 mg/dL (ref >39.00)
LDL Cholesterol: 79 mg/dL (ref 0–99)
NonHDL: 94.19
TRIGLYCERIDES: 75 mg/dL (ref 0.0–149.0)
Total CHOL/HDL Ratio: 3
VLDL: 15 mg/dL (ref 0.0–40.0)

## 2018-12-22 LAB — VITAMIN D 25 HYDROXY (VIT D DEFICIENCY, FRACTURES): VITD: 9.97 ng/mL — ABNORMAL LOW (ref 30.00–100.00)

## 2018-12-22 NOTE — Progress Notes (Signed)
Subjective:    Patient ID: Sheila Bates, female    DOB: 1986-07-06, 33 y.o.   MRN: [id number]  HPI Patient presents for yearly preventative medicine examination. Pleasant 33 year old female who  has a past medical history of Lupus (HCC), Rheumatoid arthritis (HCC), and Vaginal Pap smear, abnormal.  Essential Hypertension - was started on Norvasc 2.5 mg one week ago for elevated blood pressure readings. She has checked once since starting this medication and reports BP readings in the 110/80's.   History of Lupus and RA - is followed by [facility] but has not been back to see them since 02/2018   H/o Vitamin D Deficiency -is been placed on vitamin D supplement in the past, but she reports that this was many years ago.  She would like to recheck her vitamin D levels today.  Also complains of a longstanding history of vague symptoms such as dizziness, fatigue, and intermittent episodes of chest pain.  She reports that over the last couple of weeks she is experiencing chest pain, she is unsure if this is muscular because she has been working out warm or if this is something more cardiac related.  Chest pain is located over the midsternal and left chest wall, was felt as a soreness.  Denies radiating pain up the jaw or down the left arm or palpitations  All immunizations and health maintenance protocols were reviewed with the patient and needed orders were placed. She is due for flu shot.   Appropriate screening laboratory values were ordered for the patient including screening of hyperlipidemia, renal function and hepatic function.  Medication reconciliation,  past medical history, social history, problem list and allergies were reviewed in detail with the patient  Goals were established with regard to weight loss, exercise, and  diet in compliance with medications. She is back to working out and eating healthy  Review of Systems  Constitutional: Positive for fatigue.  HENT:  Negative.   Eyes: Negative.   Respiratory: Negative.   Cardiovascular: Positive for chest pain.  Gastrointestinal: Negative.   Endocrine: Negative.   Genitourinary: Negative.   Musculoskeletal: Negative.   Allergic/Immunologic: Negative.   Neurological: Positive for dizziness.  Hematological: Negative.   Psychiatric/Behavioral: Negative.   All other systems reviewed and are negative.  Past Medical History:  Diagnosis Date  . 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 Rheumatoid arthritis (HCC)    mild affecting hands  . Vaginal Pap smear, abnormal     Social History   Socioeconomic History  . Marital status: Married    Spouse name: Not on file  . Number of children: Not on file  . Years of education: Not on file  . Highest education level: Not on file  Occupational History  . Not on file  Social Needs  . Financial resource strain: Not on file  . Food insecurity:    Worry: Not on file    Inability: Not on file  . Transportation needs:    Medical: Not on file    Non-medical: Not on file  Tobacco Use  . Smoking status: Never Smoker  . Smokeless tobacco: Never Used  Substance and Sexual Activity  . Alcohol use: No  . Drug use: No  . Sexual activity: Yes    Birth control/protection: Other-see comments    Comment: visectomy   Lifestyle  . Physical activity:    Days per week: Not on file    Minutes per session: Not on  file  . Stress: Not on file  Relationships  . Social connections:    Talks on phone: Not on file    Gets together: Not on file    Attends religious service: Not on file    Active member of club or organization: Not on file    Attends meetings of clubs or organizations: Not on file    Relationship status: Not on file  . Intimate partner violence:    Fear of current or ex partner: Not on file    Emotionally abused: Not on file    Physically abused: Not on file    Forced sexual activity: Not on file  Other Topics Concern  . Not on file  Social  History Narrative  . Not on file    Past Surgical History:  Procedure Laterality Date  . BARTHOLIN GLAND CYST EXCISION    . CESAREAN SECTION N/A 05/12/2014   Procedure: Primary Cesarean Section Delivery Baby Girl @ 2353, Apgars;  Surgeon: Lavina Hammanodd Meisinger, MD;  Location: [facility];  Service: Obstetrics;  Laterality: N/A;  . CESAREAN SECTION N/A 04/28/2018   Procedure: REPEAT CESAREAN SECTION;  Surgeon: Huel Coteichardson, Kathy, MD;  [facility];  Service: Obstetrics;  Laterality: N/A;  MD request RNFA  . I&D EXTREMITY     inguinal draining during pregnancy  . MYRINGOTOMY    . WISDOM TOOTH EXTRACTION      Family History  Problem Relation Age of Onset  . Hypertension Father   . Diabetes Father   . Lupus Maternal Aunt   . Breast cancer Paternal Aunt   . Lupus Maternal Grandmother   . Diabetes Paternal Grandmother     No Known Allergies  Current Outpatient Medications on File Prior to Visit  Medication Sig Dispense Refill  . amLODipine (NORVASC) 2.5 MG tablet Take 1 tablet (2.5 mg total) by mouth daily. 30 tablet 0  . calcium carbonate (TUMS - DOSED IN MG ELEMENTAL CALCIUM) 500 MG chewable tablet Chew 2 tablets by mouth daily as needed for indigestion or heartburn.     No current facility-administered medications on file prior to visit.     BP 108/74   Temp 98.3 F (36.8 C)   Ht 5\' 2"  (1.575 m)   Wt 169 lb (76.7 kg)   BMI 30.91 kg/m       Objective:   Physical Exam Vitals signs and nursing note reviewed.  Constitutional:      General: She is not in acute distress.    Appearance: Normal appearance. She is normal weight. She is not ill-appearing.  HENT:     Head: Normocephalic and atraumatic.     Right Ear: Tympanic membrane, ear canal and external ear normal. There is no impacted cerumen.     Left Ear: Tympanic membrane, ear canal and external ear normal. There is no impacted cerumen.     Nose: Nose normal. No congestion or rhinorrhea.     Mouth/Throat:      Mouth: Mucous membranes are moist.     Pharynx: Oropharynx is clear. No oropharyngeal exudate or posterior oropharyngeal erythema.  Eyes:     General: No scleral icterus.       Right eye: No discharge.        Left eye: No discharge.     Extraocular Movements: Extraocular movements intact.     Conjunctiva/sclera: Conjunctivae normal.     Pupils: Pupils are equal, round, and reactive to light.  Neck:     Musculoskeletal: Normal range of  motion and neck supple.  Cardiovascular:     Rate and Rhythm: Normal rate and regular rhythm.     Pulses: Normal pulses.     Heart sounds: Normal heart sounds. No murmur. No friction rub. No gallop.   Pulmonary:     Effort: Pulmonary effort is normal.     Breath sounds: Normal breath sounds. No wheezing.  Chest:     Chest wall: Tenderness (with palpation throught midsternal and left chest wall ) present.  Abdominal:     General: Abdomen is flat. Bowel sounds are normal. There is no distension.     Palpations: Abdomen is soft. There is no mass.     Tenderness: There is no abdominal tenderness. There is no rebound.     Hernia: No hernia is present.  Musculoskeletal: Normal range of motion.        General: No swelling, deformity or signs of injury.     Right lower leg: No edema.     Left lower leg: No edema.  Skin:    General: Skin is warm and dry.     Capillary Refill: Capillary refill takes less than 2 seconds.     Coloration: Skin is not jaundiced or pale.     Findings: No bruising, erythema, lesion or rash.  Neurological:     General: No focal deficit present.     Mental Status: She is alert and oriented to person, place, and time. Mental status is at baseline.  Psychiatric:        Mood and Affect: Mood normal.        Behavior: Behavior normal.        Thought Content: Thought content normal.        Judgment: Judgment normal.        Assessment & Plan:  1. Routine general medical examination at a health care facility - Continue to work on  weight loss through diet and exercise  - Follow up in one year or sooner if needed - CBC with Differential/Platelet - Comprehensive metabolic panel - Lipid panel - TSH - Iron, TIBC and Ferritin Panel - Vitamin D, 25-hydroxy  2. Lupus (HCC) - Follow up with [facility]   3. Chest pain, unspecified type - Muscular in origin.  - CBC with Differential/Platelet - Comprehensive metabolic panel - Lipid panel - TSH - Iron, TIBC and Ferritin Panel - EKG 12-Lead- Sinus rhythm, Rate 82   4. Need for prophylactic vaccination and inoculation against influenza  - Flu Vaccine QUAD 6+ mos PF IM (Fluarix Quad PF)  5. Elevated blood-pressure reading without diagnosis of hypertension - Continue with Norvasc 2.5 mg  - CBC with Differential/Platelet - Comprehensive metabolic panel  6. Vitamin D deficiency  - Vitamin D, 25-hydroxy - Consider Vitamin D supplementation  7. Fatigue, unspecified type - Possibly due to anemia or vitamin D deficiency. Likely due to life stressors of working and caring for a new baby  - CBC with Differential/Platelet - Comprehensive metabolic panel - TSH - Iron, TIBC and Ferritin Panel - Vitamin D, 25-hydroxy  [name], NP

## 2018-12-23 ENCOUNTER — Other Ambulatory Visit: Payer: Self-pay | Admitting: Family Medicine

## 2018-12-23 LAB — IRON,TIBC AND FERRITIN PANEL
%SAT: 30 % (calc) (ref 16–45)
Ferritin: 76 ng/mL (ref 16–154)
IRON: 105 ug/dL (ref 40–190)
TIBC: 347 mcg/dL (calc) (ref 250–450)

## 2018-12-23 MED ORDER — VITAMIN D (ERGOCALCIFEROL) 1.25 MG (50000 UNIT) PO CAPS: 50000.0000 [IU] | ORAL_CAPSULE | ORAL | 1 refills | Status: DC

## 2019-01-11 ENCOUNTER — Other Ambulatory Visit: Payer: Self-pay | Admitting: Adult Health

## 2019-01-12 NOTE — Telephone Encounter (Signed)
Sent to the pharmacy by e-scribe. 

## 2019-01-26 ENCOUNTER — Encounter: Payer: Self-pay | Admitting: Adult Health

## 2019-01-26 ENCOUNTER — Ambulatory Visit (INDEPENDENT_AMBULATORY_CARE_PROVIDER_SITE_OTHER): Payer: BC Managed Care – PPO | Admitting: Adult Health

## 2019-01-26 ENCOUNTER — Other Ambulatory Visit: Payer: Self-pay

## 2019-01-26 DIAGNOSIS — J0100 Acute maxillary sinusitis, unspecified: Secondary | ICD-10-CM | POA: Diagnosis not present

## 2019-01-26 NOTE — Progress Notes (Addendum)
Virtual Visit via Telephone Note  I connected with Sheila Bates on 01/26/19 at  5:00 PM EDT by telephone and verified that I am speaking with the correct person using two identifiers.   I discussed the limitations, risks, security and privacy concerns of performing an evaluation and management service by telephone and the availability of in person appointments. I also discussed with the patient that there may be a patient responsible charge related to this service. The patient expressed understanding and agreed to proceed.  Location patient: home Location provider: work or home office Participants present for the call: patient, provider Patient did not have a visit in the prior 7 days to address this/these issue(s).   History of Present Illness: This is a 33 year old female who is being evaluated for an acute complaint.  She reports that her symptoms started approximately 4 to 5 days ago symptoms include sinus pain/pressure, nasal and chest congestion, sore throat.  She denies fever, chills, shortness of breath, wheezing, or rhinorrhea.  She has been using various over-the-counter medications such as DayQuil and NyQuil reports that this helps with her symptoms for a few hours.   Observations/Objective: Patient sounds cheerful and well on the phone. I do not appreciate any SOB. Speech and thought processing are grossly intact. Patient reported vitals:  Assessment and Plan:  does not appear to be covered 19 related.  Possible sinusitis due to virus or allergic trigger.  Being that it is only been a 4 days since symptoms started we will have her trial using Flonase and Claritin.  Follow Up Instructions: I did not refer this patient for an OV in the next 24 hours for this/these issue(s).  I discussed the assessment and treatment plan with the patient. The patient was provided an opportunity to ask questions and all were answered. The patient agreed with the plan and demonstrated an  understanding of the instructions.   The patient was advised to call back or seek an in-person evaluation if the symptoms worsen or if the condition fails to improve as anticipated.  I provided 22 minutes of non-face-to-face time during this encounter.   [name], NP

## 2019-01-27 ENCOUNTER — Telehealth: Payer: Self-pay | Admitting: Adult Health

## 2019-01-27 NOTE — Telephone Encounter (Signed)
Copied from CRM [id number]. Topic: Quick Communication - See Telephone Encounter >> Jan 27, 2019  4:05 PM Arlyss Gandy, NT wrote: CRM for notification. See Telephone encounter for: 01/27/19. Pt states she believes she has developed pink eye since her visit with [facility] yesterday and would like to see if she needs another appt or if meds can be sent in?

## 2019-01-29 NOTE — Telephone Encounter (Signed)
Spoke to patient and her symptoms are resolving

## 2019-07-07 ENCOUNTER — Other Ambulatory Visit: Payer: Self-pay | Admitting: Adult Health

## 2019-08-04 ENCOUNTER — Other Ambulatory Visit: Payer: Self-pay

## 2019-08-04 ENCOUNTER — Emergency Department (HOSPITAL_BASED_OUTPATIENT_CLINIC_OR_DEPARTMENT_OTHER): Payer: 59

## 2019-08-04 ENCOUNTER — Encounter (HOSPITAL_BASED_OUTPATIENT_CLINIC_OR_DEPARTMENT_OTHER): Payer: Self-pay

## 2019-08-04 ENCOUNTER — Other Ambulatory Visit: Payer: Self-pay | Admitting: Adult Health

## 2019-08-04 ENCOUNTER — Emergency Department (HOSPITAL_BASED_OUTPATIENT_CLINIC_OR_DEPARTMENT_OTHER)
Admission: EM | Admit: 2019-08-04 | Discharge: 2019-08-04 | Disposition: A | Discharge status: Home / Self Care | Payer: 59 | Attending: Emergency Medicine | Admitting: Emergency Medicine

## 2019-08-04 ENCOUNTER — Ambulatory Visit: Payer: BC Managed Care – PPO | Admitting: Family Medicine

## 2019-08-04 DIAGNOSIS — R42 Dizziness and giddiness: Secondary | ICD-10-CM | POA: Diagnosis not present

## 2019-08-04 DIAGNOSIS — K219 Gastro-esophageal reflux disease without esophagitis: Secondary | ICD-10-CM | POA: Insufficient documentation

## 2019-08-04 DIAGNOSIS — M069 Rheumatoid arthritis, unspecified: Secondary | ICD-10-CM | POA: Insufficient documentation

## 2019-08-04 DIAGNOSIS — Z79899 Other long term (current) drug therapy: Secondary | ICD-10-CM | POA: Insufficient documentation

## 2019-08-04 DIAGNOSIS — R079 Chest pain, unspecified: Secondary | ICD-10-CM | POA: Diagnosis present

## 2019-08-04 DIAGNOSIS — R030 Elevated blood-pressure reading, without diagnosis of hypertension: Secondary | ICD-10-CM

## 2019-08-04 DIAGNOSIS — R0789 Other chest pain: Secondary | ICD-10-CM | POA: Insufficient documentation

## 2019-08-04 HISTORY — DX: Vitamin D deficiency, unspecified: E55.9

## 2019-08-04 LAB — CBC
HCT: 41.7 % (ref 36.0–46.0)
Hemoglobin: 13.1 g/dL (ref 12.0–15.0)
MCH: 28.8 pg (ref 26.0–34.0)
MCHC: 31.4 g/dL (ref 30.0–36.0)
MCV: 91.6 fL (ref 80.0–100.0)
Platelets: 245 10*3/uL (ref 150–400)
RBC: 4.55 MIL/uL (ref 3.87–5.11)
RDW: 12.1 % (ref 11.5–15.5)
WBC: 3.5 10*3/uL — ABNORMAL LOW (ref 4.0–10.5)
nRBC: 0 % (ref 0.0–0.2)

## 2019-08-04 LAB — BASIC METABOLIC PANEL
Anion gap: 9 (ref 5–15)
BUN: 9 mg/dL (ref 6–20)
CO2: 25 mmol/L (ref 22–32)
Calcium: 9.3 mg/dL (ref 8.9–10.3)
Chloride: 102 mmol/L (ref 98–111)
Creatinine, Ser: 0.61 mg/dL (ref 0.44–1.00)
GFR calc Af Amer: >60 mL/min (ref >60)
GFR calc non Af Amer: >60 mL/min (ref >60)
Glucose, Bld: 99 mg/dL (ref 70–99)
Potassium: 3.9 mmol/L (ref 3.5–5.1)
Sodium: 136 mmol/L (ref 135–145)

## 2019-08-04 LAB — TROPONIN I (HIGH SENSITIVITY): Troponin I (High Sensitivity): <2 ng/L (ref <18)

## 2019-08-04 LAB — PREGNANCY, URINE: Preg Test, Ur: NEGATIVE

## 2019-08-04 MED ORDER — FAMOTIDINE 20 MG PO TABS: 20.0000 mg | ORAL_TABLET | Freq: Every day | ORAL | 0 refills | Status: DC

## 2019-08-04 MED ORDER — SODIUM CHLORIDE 0.9% FLUSH
3.0000 mL | Freq: Once | INTRAVENOUS | Status: DC
  Filled 2019-08-04: qty 3

## 2019-08-04 NOTE — Telephone Encounter (Signed)
DENIED.  FILLED ON 01/12/2019 FOR 1 YEAR.  REQUEST IS TOO EARLY.

## 2019-08-04 NOTE — ED Provider Notes (Signed)
[facility] EMERGENCY DEPARTMENT Provider Note   CSN: [id number] Arrival date & time: 08/04/19  1505     History   Chief Complaint Chief Complaint  Patient presents with  . Chest Pain    HPI Sheila Bates is a 33 y.o. female.     33yo F w/ PMH including lupus, RA, HTN who p/w reflux and dizziness. She has had problems with heartburn for the past year. She reports intermittent pain up into her throat from her chest, occasionally into her back, that usually resolves with tums. She had an episode last week that didn't seem to respond to tums. None today. Occasional associated nausea, no vomiting or SOB. Symptoms are random, no specific pattern. She made appt w/ PCP for tomorrow. Today, she had an episode of lightheadedness/dizziness, which has been happening since February. Can occur while sitting or standing. In feb, PCP said BP was high and Vit D was low so she was prescribed medications. She hasn't had repeat bloodwork yet. She eventually self-discontinued her amlodipine medication. No abdominal pain, post-prandial pain, cough/cold symptoms, vomiting, fevers, or recent illness. No recent travel, estrogen use, h/o blood clots, or history of cancer. No heavy alcohol or NSAID use.   The history is provided by the patient.  Chest Pain   Past Medical History:  Diagnosis Date  . 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 Rheumatoid arthritis (HCC)    mild affecting hands  . Vaginal Pap smear, abnormal   . Vitamin D deficiency     Patient Active Problem List   Diagnosis Date Noted  . History of cesarean section complicating pregnancy 04/28/2018  . Status post repeat low transverse cesarean section 04/28/2018  . Arrest of descent, delivered, current hospitalization 05/13/2014  . Other specified indication for care or intervention related to labor and delivery, unspecified as to episode of care 05/11/2014  . Cellulitis and abscess 01/05/2014  . Lupus (HCC)  09/20/2013    Past Surgical History:  Procedure Laterality Date  . BARTHOLIN GLAND CYST EXCISION    . CESAREAN SECTION N/A 05/12/2014   Procedure: Primary Cesarean Section Delivery Baby Girl @ 2353, Apgars;  Surgeon: Lavina Hamman, MD;  Location: [facility];  Service: Obstetrics;  Laterality: N/A;  . CESAREAN SECTION N/A 04/28/2018   Procedure: REPEAT CESAREAN SECTION;  Surgeon: Huel Cote, MD;  Location: [facility];  Service: Obstetrics;  Laterality: N/A;  MD request RNFA  . I&D EXTREMITY     inguinal draining during pregnancy  . MYRINGOTOMY    . WISDOM TOOTH EXTRACTION       OB History    Gravida  4   Para  2   Term  2   Preterm  0   AB  2   Living  2     SAB  1   TAB  1   Ectopic  0   Multiple  0   Live Births  2            Home Medications    Prior to Admission medications   Medication Sig Start Date End Date Taking? Authorizing Provider  amLODipine (NORVASC) 2.5 MG tablet TAKE 1 TABLET BY MOUTH EVERY DAY 01/12/19   Nafziger, Kandee Keen, NP  calcium carbonate (TUMS - DOSED IN MG ELEMENTAL CALCIUM) 500 MG chewable tablet Chew 2 tablets by mouth daily as needed for indigestion or heartburn.    [provider]  famotidine (PEPCID) 20 MG tablet Take 1 tablet (  20 mg total) by mouth daily. 08/04/19   Little, Ambrose Finland, MD  Vitamin D, Ergocalciferol, (DRISDOL) 1.25 MG (50000 UT) CAPS capsule TAKE 1 CAPSULE (50,000 UNITS TOTAL) BY MOUTH EVERY 7 (SEVEN) DAYS. 07/07/19   [name], NP    Family History Family History  Problem Relation Age of Onset  . Hypertension Father   . Diabetes Father   . Lupus Maternal Aunt   . Breast cancer Paternal Aunt   . Lupus Maternal Grandmother   . Diabetes Paternal Grandmother     Social History Social History   Tobacco Use  . Smoking status: Never Smoker  . Smokeless tobacco: Never Used  Substance Use Topics  . Alcohol use: Yes    Comment: weekly  . Drug use: No     Allergies   Patient has no  known allergies.   Review of Systems Review of Systems  Cardiovascular: Positive for chest pain.  All other systems reviewed and are negative except that which was mentioned in HPI    Physical Exam Updated Vital Signs BP (!) 159/102 (BP Location: Left Arm)   Pulse 79   Temp 98.2 F (36.8 C) (Oral)   Resp 18   Ht 5\' 2"  (1.575 m)   Wt 73.9 kg   LMP 07/22/2019   SpO2 100%   BMI 29.81 kg/m   Physical Exam Vitals signs and nursing note reviewed.  Constitutional:      General: She is not in acute distress.    Appearance: She is well-developed.  HENT:     Head: Normocephalic and atraumatic.  Eyes:     Conjunctiva/sclera: Conjunctivae normal.  Neck:     Musculoskeletal: Neck supple.  Cardiovascular:     Rate and Rhythm: Normal rate and regular rhythm.     Heart sounds: Normal heart sounds. No murmur.  Pulmonary:     Effort: Pulmonary effort is normal.     Breath sounds: Normal breath sounds.  Abdominal:     General: Bowel sounds are normal. There is no distension.     Palpations: Abdomen is soft.     Tenderness: There is no abdominal tenderness.  Skin:    General: Skin is warm and dry.  Neurological:     Mental Status: She is alert and oriented to person, place, and time.     Comments: Fluent speech  Psychiatric:        Judgment: Judgment normal.      ED Treatments / Results  Labs (all labs ordered are listed, but only abnormal results are displayed) Labs Reviewed  CBC - Abnormal; Notable for the following components:      Result Value   WBC 3.5 (*)    All other components within normal limits  BASIC METABOLIC PANEL  PREGNANCY, URINE  TROPONIN I (HIGH SENSITIVITY)  TROPONIN I (HIGH SENSITIVITY)    EKG EKG Interpretation  Date/Time:  Wednesday August 04 2019 15:37:00 EDT Ventricular Rate:  70 PR Interval:  140 QRS Duration: 78 QT Interval:  398 QTC Calculation: 429 R Axis:   75 Text Interpretation:  Normal sinus rhythm Normal ECG No  significant change since last tracing Confirmed by Frederick Peers [phone]) on 08/04/2019 5:14:03 PM   Radiology Dg Chest 2 View  Result Date: 08/04/2019 CLINICAL DATA:  Chest pain EXAM: CHEST - 2 VIEW COMPARISON:  03/14/2015 FINDINGS: The heart size and mediastinal contours are within normal limits. Both lungs are clear. The visualized skeletal structures are unremarkable. IMPRESSION: No acute abnormality of the lungs.  Electronically Signed   By: Eddie Candle M.D.   On: 08/04/2019 16:12    Procedures Procedures (including critical care time)  Medications Ordered in ED Medications - No data to display   Initial Impression / Assessment and Plan / ED Course  I have reviewed the triage vital signs and the nursing notes.  Pertinent labs & imaging results that were available during my care of the patient were reviewed by me and considered in my medical decision making (see chart for details).       Well-appearing on exam, initially hypertensive at 159/102 which did improve during ED course.  EKG normal.  Chest x-ray clear.  Basic lab work and troponin from triage unremarkable.  The chronicity of her symptoms and initial relief with Tums does suggest acid reflux.  I have recommended that she start daily Pepcid.  Regarding her dizziness, she has no signs of anemia, dehydration, or other electrolyte abnormality and no evidence of cardiac arrhythmia.  No concerning neurologic features and history.  She does admit to noncompliance with amlodipine and because her blood pressure is elevated here, I have strongly recommended that she discuss this with her PCP as she may need to reinitiate antihypertensive medication.  She has appointment at clinic scheduled for tomorrow.  She voiced understanding of plan.  Final Clinical Impressions(s) / ED Diagnoses   Final diagnoses:  Gastroesophageal reflux disease, esophagitis presence not specified  Atypical chest pain  Dizziness  Elevated blood pressure reading     ED Discharge Orders         Ordered    famotidine (PEPCID) 20 MG tablet  Daily     08/04/19 1751           Little, Wenda Overland, MD 08/04/19 1758

## 2019-08-04 NOTE — ED Notes (Signed)
Patient denies pain and is resting comfortably.  

## 2019-08-04 NOTE — ED Triage Notes (Signed)
Pt c/o intermittent CP "heartburn" x 1 year-c/o intermittent dizziness, light headed x 8 months-worse x 2 months-NAD-steady gait

## 2019-08-05 ENCOUNTER — Ambulatory Visit: Payer: BC Managed Care – PPO | Admitting: Adult Health

## 2019-09-23 ENCOUNTER — Other Ambulatory Visit: Payer: Self-pay

## 2019-09-23 ENCOUNTER — Encounter: Payer: Self-pay | Admitting: Cardiology

## 2019-09-23 ENCOUNTER — Ambulatory Visit (INDEPENDENT_AMBULATORY_CARE_PROVIDER_SITE_OTHER): Payer: 59 | Admitting: Cardiology

## 2019-09-23 VITALS — BP 134/84 | HR 84 | Ht 62.0 in | Wt 162.0 lb

## 2019-09-23 DIAGNOSIS — I10 Essential (primary) hypertension: Secondary | ICD-10-CM | POA: Diagnosis not present

## 2019-09-23 DIAGNOSIS — R42 Dizziness and giddiness: Secondary | ICD-10-CM | POA: Diagnosis not present

## 2019-09-23 DIAGNOSIS — R000 Tachycardia, unspecified: Secondary | ICD-10-CM | POA: Diagnosis not present

## 2019-09-23 DIAGNOSIS — M069 Rheumatoid arthritis, unspecified: Secondary | ICD-10-CM | POA: Insufficient documentation

## 2019-09-23 NOTE — Progress Notes (Signed)
Patient referred by Sheila Landsman, MD for tachycardia  Subjective:   Sheila Bates, female    DOB: [birth year], 33 y.o.   MRN: [id number]   Chief Complaint  Patient presents with  . Tachycardia  . New Patient (Initial Visit)     HPI  33 year old African-American female with hypertension, referred for evaluation of tachycardia, lightheadedness, blurry vision.   Patient was seen at [facility] emergency department on 08/04/2019 with complaints of dizziness.  Other than hypertension, work-up was essentially unremarkable.  Patient works as a Geophysicist/field seismologist at [employer].  While her dog sedentary, she works out regularly 3-4 times a week, including spin classes, walking etc.  She does not have any chest pain, shortness of breath, presyncope or syncope symptoms during exertion.  She has random episodes of heart racing sensation, rate noted to be around 110 bpm on Apple Watch, associated with lightheadedness and blurry vision.  These episodes occur irrespective of exertion, and last for 10 to 15 minutes.  She has not had 1 such episode in last 2 months.  She was diagnosed with hypertension after delivery of her second child.  She has been on amlodipine 2.5 mg daily.  She does not plan to have any children.  She was told to have rheumatoid arthritis and lupus in 2010.  However, subsequent work-up has showed no clinical evidence of the same.    Past Medical History:  Diagnosis Date  . Lupus ([place])    traits only not complete  . Rheumatoid arthritis (HCC)    mild affecting hands  . Vaginal Pap smear, abnormal   . Vitamin D deficiency      Past Surgical History:  Procedure Laterality Date  . BARTHOLIN GLAND CYST EXCISION    . CESAREAN SECTION N/A 05/12/2014   Procedure: Primary Cesarean Section Delivery Baby Girl @ 2376, Apgars;  Surgeon: Cheri Fowler, MD;  Location: [facility];  Service: Obstetrics;  Laterality: N/A;  . CESAREAN SECTION N/A 04/28/2018   Procedure:  REPEAT CESAREAN SECTION;  Surgeon: Paula Compton, MD;  Location: [facility];  Service: Obstetrics;  Laterality: N/A;  MD request RNFA  . I&D EXTREMITY     inguinal draining during pregnancy  . MYRINGOTOMY    . WISDOM TOOTH EXTRACTION       Social History   Socioeconomic History  . Marital status: Married    Spouse name: Not on file  . Number of children: Not on file  . Years of education: Not on file  . Highest education level: Not on file  Occupational History  . Not on file  Social Needs  . Financial resource strain: Not on file  . Food insecurity    Worry: Not on file    Inability: Not on file  . Transportation needs    Medical: Not on file    Non-medical: Not on file  Tobacco Use  . Smoking status: Never Smoker  . Smokeless tobacco: Never Used  Substance and Sexual Activity  . Alcohol use: Yes    Comment: weekly  . Drug use: No  . Sexual activity: Not on file  Lifestyle  . Physical activity    Days per week: Not on file    Minutes per session: Not on file  . Stress: Not on file  Relationships  . Social Herbalist on phone: Not on file    Gets together: Not on file    Attends religious service: Not on file  Active member of club or organization: Not on file    Attends meetings of clubs or organizations: Not on file    Relationship status: Not on file  . Intimate partner violence    Fear of current or ex partner: Not on file    Emotionally abused: Not on file    Physically abused: Not on file    Forced sexual activity: Not on file  Other Topics Concern  . Not on file  Social History Narrative  . Not on file     Family History  Problem Relation Age of Onset  . Hypertension Father   . Diabetes Father   . Lupus Maternal Aunt   . Breast cancer Paternal Aunt   . Lupus Maternal Grandmother   . Diabetes Paternal Grandmother      Current Outpatient Medications on File Prior to Visit  Medication Sig Dispense Refill  . amLODipine  (NORVASC) 2.5 MG tablet TAKE 1 TABLET BY MOUTH EVERY DAY 90 tablet 3  . famotidine (PEPCID) 20 MG tablet Take 1 tablet (20 mg total) by mouth daily. 30 tablet 0  . NON FORMULARY sea moss    . Vitamin D, Ergocalciferol, (DRISDOL) 1.25 MG (50000 UT) CAPS capsule TAKE 1 CAPSULE (50,000 UNITS TOTAL) BY MOUTH EVERY 7 (SEVEN) DAYS. 12 capsule 1   No current facility-administered medications on file prior to visit.     Cardiovascular studies:  EKG 09/23/2019:  Sinus rhythm 78 bpm. Normal EKG.  EKG 08/04/2019:  Sinus rhythm 70 bpm.  Normal EKG.   Recent labs: Results for Sheila Bates (MRN [id number]) as of 09/23/2019 08:30  Ref. Range 12/22/2018 15:22 08/04/2019 16:25  Sodium Latest Ref Range: 135 - 145 mmol/L 138 136  Potassium Latest Ref Range: 3.5 - 5.1 mmol/L 3.9 3.9  Chloride Latest Ref Range: 98 - 111 mmol/L 103 102  CO2 Latest Ref Range: 22 - 32 mmol/L 28 25  Glucose Latest Ref Range: 70 - 99 mg/dL 81 99  BUN Latest Ref Range: 6 - 20 mg/dL 13 9  Creatinine Latest Ref Range: 0.44 - 1.00 mg/dL 4.090.73 8.110.61  Calcium Latest Ref Range: 8.9 - 10.3 mg/dL 9.2 9.3  Anion gap Latest Ref Range: 5 - 15   9  Alkaline Phosphatase Latest Ref Range: 39 - 117 U/L 81   Albumin Latest Ref Range: 3.5 - 5.2 g/dL 4.2   AST Latest Ref Range: 0 - 37 U/L 12   ALT Latest Ref Range: 0 - 35 U/L 8   Total Protein Latest Ref Range: 6.0 - 8.3 g/dL 6.9   Total Bilirubin Latest Ref Range: 0.2 - 1.2 mg/dL 0.5   GFR Latest Ref Range: >60.00 mL/min 109.74   GFR, Est Non African American Latest Ref Range: >60 mL/min  >60  GFR, Est African American Latest Ref Range: >60 mL/min  >60  Troponin I (High Sensitivity) Latest Ref Range: <18 ng/L  <2   Results for Sheila Bates (MRN [id number]) as of 09/23/2019 08:30  Ref. Range 12/22/2018 15:22  Total CHOL/HDL Ratio Unknown 3  Cholesterol Latest Ref Range: 0 - 200 mg/dL 213148  HDL Cholesterol Latest Ref Range: >39.00 mg/dL 08.6553.80  LDL (calc) Latest Ref  Range: 0 - 99 mg/dL 79  NonHDL Unknown 78.4694.19  Triglycerides Latest Ref Range: 0.0 - 149.0 mg/dL 96.275.0  VLDL Latest Ref Range: 0.0 - 40.0 mg/dL 95.215.0     Review of Systems  Constitution: Negative for decreased appetite, malaise/fatigue, weight gain and weight loss.  HENT:  Negative for congestion.   Eyes: Negative for visual disturbance.  Cardiovascular: Positive for palpitations. Negative for chest pain, dyspnea on exertion, leg swelling and syncope.  Respiratory: Negative for cough.   Endocrine: Negative for cold intolerance.  Hematologic/Lymphatic: Does not bruise/bleed easily.  Skin: Negative for itching and rash.  Musculoskeletal: Negative for myalgias.  Gastrointestinal: Negative for abdominal pain, nausea and vomiting.  Genitourinary: Negative for dysuria.  Neurological: Positive for light-headedness. Negative for dizziness and weakness.  Psychiatric/Behavioral: The patient is not nervous/anxious.   All other systems reviewed and are negative.        Vitals:   09/23/19 0928  BP: (!) 139/93  Pulse: 90  SpO2: 100%     Body mass index is 29.63 kg/m. Filed Weights   09/23/19 0928  Weight: 162 lb (73.5 kg)     Objective:   Physical Exam  Constitutional: She is oriented to person, place, and time. She appears well-developed and well-nourished. No distress.  HENT:  Head: Normocephalic and atraumatic.  Eyes: Pupils are equal, round, and reactive to light. Conjunctivae are normal.  Neck: No JVD present.  Cardiovascular: Normal rate, regular rhythm and intact distal pulses.  No murmur heard. Pulmonary/Chest: Effort normal and breath sounds normal. She has no wheezes. She has no rales.  Abdominal: Soft. Bowel sounds are normal. There is no rebound.  Musculoskeletal:        General: No edema.  Lymphadenopathy:    She has no cervical adenopathy.  Neurological: She is alert and oriented to person, place, and time. No cranial nerve deficit.  Skin: Skin is warm and dry.   Psychiatric: She has a normal mood and affect.  Nursing note and vitals reviewed.         Assessment & Recommendations:   33 year old African-American female with hypertension, referred for evaluation of tachycardia, lightheadedness, blurry vision.   Episodes are infrequent, unrelated to exertion.  I will obtain echocardiogram to rule out any structural cardiac abnormality.  Event monitor would be a low yield test at this time, given that she is not having symptoms for last 2 months.  I recommended her to contact us, in case she has increased frequency and severity of symptoms.  In that case, will recommend 4-week event monitor for palpitations.  I will see her on as-needed basis, unless significant abnormalities noted on echocardiogram.  Thank you for referring the patient to [place]. Please feel free to contact with any questions.  Elder Negus, MD [facility] Pager: [phone] Office: [phone]

## 2019-10-07 ENCOUNTER — Ambulatory Visit (INDEPENDENT_AMBULATORY_CARE_PROVIDER_SITE_OTHER): Payer: BC Managed Care – PPO

## 2019-10-07 ENCOUNTER — Other Ambulatory Visit: Payer: Self-pay

## 2019-10-07 DIAGNOSIS — R42 Dizziness and giddiness: Secondary | ICD-10-CM

## 2019-10-07 DIAGNOSIS — R000 Tachycardia, unspecified: Secondary | ICD-10-CM | POA: Diagnosis not present

## 2019-10-07 DIAGNOSIS — R002 Palpitations: Secondary | ICD-10-CM

## 2019-10-11 ENCOUNTER — Telehealth: Payer: Self-pay

## 2019-10-11 NOTE — Progress Notes (Signed)
Called pt to inform her about the message. Pt understood and will make an appt.

## 2019-10-11 NOTE — Progress Notes (Signed)
Please arrange a four week event monitor for palpitations.

## 2019-10-11 NOTE — Progress Notes (Signed)
Called pt to inform her about her echo results. Pt mention that she has had some palps for the past 19 hours.

## 2019-10-12 ENCOUNTER — Ambulatory Visit: Payer: BC Managed Care – PPO

## 2019-10-12 ENCOUNTER — Other Ambulatory Visit: Payer: Self-pay

## 2019-10-12 DIAGNOSIS — R000 Tachycardia, unspecified: Secondary | ICD-10-CM

## 2019-10-12 DIAGNOSIS — R002 Palpitations: Secondary | ICD-10-CM

## 2019-10-27 NOTE — Telephone Encounter (Signed)
appt made

## 2019-11-03 ENCOUNTER — Telehealth: Payer: Self-pay | Admitting: Cardiology

## 2019-11-03 ENCOUNTER — Telehealth: Payer: Self-pay

## 2019-11-03 NOTE — Telephone Encounter (Signed)
Patient is currently lying in the monitor.  Noted to have 7 beat nonsustained VT episode at 5: 40 1 AM on 11/03/2019.  Recommend completing 30-day duration of event monitor, and follow-up office visit after that to discuss event monitor findings and management.  Staff, please let the patient know.

## 2019-11-03 NOTE — Telephone Encounter (Signed)
Unable to reach pt

## 2019-11-04 NOTE — Telephone Encounter (Signed)
Still unable to reach pt

## 2020-01-18 NOTE — Telephone Encounter (Signed)
done

## 2020-01-28 ENCOUNTER — Ambulatory Visit: Payer: 59 | Attending: Internal Medicine

## 2020-01-28 DIAGNOSIS — Z23 Encounter for immunization: Secondary | ICD-10-CM

## 2020-01-28 NOTE — Progress Notes (Signed)
   Covid-19 Vaccination Clinic  Name:  Sheila Bates    MRN: [id number] DOB: Oct 26, 1986  01/28/2020  [name] was observed post Covid-19 immunization for 15 minutes without incident. She was provided with Vaccine Information Sheet and instruction to access the V-Safe system.   [name] was instructed to call 911 with any severe reactions post vaccine: Marland Kitchen Difficulty breathing  . Swelling of face and throat  . A fast heartbeat  . A bad rash all over body  . Dizziness and weakness   Immunizations Administered    Name Date Dose VIS Date Route   Pfizer COVID-19 Vaccine 01/28/2020  5:00 PM 0.3 mL 10/15/2019 Intramuscular   Manufacturer: [employer], [employer]   Lot: [id number]   NDC: [address]-9

## 2020-02-23 ENCOUNTER — Ambulatory Visit: Payer: 59 | Attending: Internal Medicine

## 2020-02-23 DIAGNOSIS — Z23 Encounter for immunization: Secondary | ICD-10-CM

## 2020-02-23 NOTE — Progress Notes (Signed)
   Covid-19 Vaccination Clinic  Name:  Sheila Bates    MRN: [id number] DOB: 02/20/1986  02/23/2020  [name] was observed post Covid-19 immunization for 15 minutes without incident. She was provided with Vaccine Information Sheet and instruction to access the V-Safe system.   [name] was instructed to call 911 with any severe reactions post vaccine: Marland Kitchen Difficulty breathing  . Swelling of face and throat  . A fast heartbeat  . A bad rash all over body  . Dizziness and weakness   Immunizations Administered    Name Date Dose VIS Date Route   Pfizer COVID-19 Vaccine 02/23/2020  4:33 PM 0.3 mL 12/29/2018 Intramuscular   Manufacturer: [employer], [employer]   Lot: [id number]   NDC: [address]-9

## 2020-04-09 ENCOUNTER — Other Ambulatory Visit: Payer: Self-pay | Admitting: Adult Health

## 2020-04-11 NOTE — Telephone Encounter (Signed)
DENIED.  PT HAS NOT BEEN SEEN SINCE 12/2018.  WILL NEED APPOINTMENT FOR FURTHER REFILLS.

## 2020-07-18 ENCOUNTER — Other Ambulatory Visit: Payer: 59

## 2020-08-10 ENCOUNTER — Emergency Department (HOSPITAL_BASED_OUTPATIENT_CLINIC_OR_DEPARTMENT_OTHER): Payer: 59

## 2020-08-10 ENCOUNTER — Other Ambulatory Visit: Payer: Self-pay

## 2020-08-10 ENCOUNTER — Emergency Department (HOSPITAL_BASED_OUTPATIENT_CLINIC_OR_DEPARTMENT_OTHER)
Admission: EM | Admit: 2020-08-10 | Discharge: 2020-08-10 | Disposition: A | Discharge status: Home / Self Care | Payer: 59 | Attending: Emergency Medicine | Admitting: Emergency Medicine

## 2020-08-10 ENCOUNTER — Encounter (HOSPITAL_BASED_OUTPATIENT_CLINIC_OR_DEPARTMENT_OTHER): Payer: Self-pay | Admitting: *Deleted

## 2020-08-10 DIAGNOSIS — R0789 Other chest pain: Secondary | ICD-10-CM | POA: Insufficient documentation

## 2020-08-10 DIAGNOSIS — I10 Essential (primary) hypertension: Secondary | ICD-10-CM | POA: Insufficient documentation

## 2020-08-10 DIAGNOSIS — Z79899 Other long term (current) drug therapy: Secondary | ICD-10-CM | POA: Insufficient documentation

## 2020-08-10 DIAGNOSIS — R42 Dizziness and giddiness: Secondary | ICD-10-CM | POA: Diagnosis not present

## 2020-08-10 DIAGNOSIS — R079 Chest pain, unspecified: Secondary | ICD-10-CM | POA: Diagnosis present

## 2020-08-10 DIAGNOSIS — M549 Dorsalgia, unspecified: Secondary | ICD-10-CM | POA: Insufficient documentation

## 2020-08-10 DIAGNOSIS — H538 Other visual disturbances: Secondary | ICD-10-CM | POA: Insufficient documentation

## 2020-08-10 LAB — COMPREHENSIVE METABOLIC PANEL
ALT: 12 U/L (ref 0–44)
AST: 17 U/L (ref 15–41)
Albumin: 4.2 g/dL (ref 3.5–5.0)
Alkaline Phosphatase: 46 U/L (ref 38–126)
Anion gap: 7 (ref 5–15)
BUN: 12 mg/dL (ref 6–20)
CO2: 27 mmol/L (ref 22–32)
Calcium: 9.1 mg/dL (ref 8.9–10.3)
Chloride: 102 mmol/L (ref 98–111)
Creatinine, Ser: 0.64 mg/dL (ref 0.44–1.00)
GFR calc non Af Amer: >60 mL/min (ref >60)
Glucose, Bld: 95 mg/dL (ref 70–99)
Potassium: 3.7 mmol/L (ref 3.5–5.1)
Sodium: 136 mmol/L (ref 135–145)
Total Bilirubin: 0.3 mg/dL (ref 0.3–1.2)
Total Protein: 7.5 g/dL (ref 6.5–8.1)

## 2020-08-10 LAB — CBC WITH DIFFERENTIAL/PLATELET
Abs Immature Granulocytes: 0.01 10*3/uL (ref 0.00–0.07)
Basophils Absolute: 0 10*3/uL (ref 0.0–0.1)
Basophils Relative: 0 %
Eosinophils Absolute: 0.1 10*3/uL (ref 0.0–0.5)
Eosinophils Relative: 2 %
HCT: 36.2 % (ref 36.0–46.0)
Hemoglobin: 11.4 g/dL — ABNORMAL LOW (ref 12.0–15.0)
Immature Granulocytes: 0 %
Lymphocytes Relative: 45 %
Lymphs Abs: 2 10*3/uL (ref 0.7–4.0)
MCH: 28.3 pg (ref 26.0–34.0)
MCHC: 31.5 g/dL (ref 30.0–36.0)
MCV: 89.8 fL (ref 80.0–100.0)
Monocytes Absolute: 0.6 10*3/uL (ref 0.1–1.0)
Monocytes Relative: 14 %
Neutro Abs: 1.7 10*3/uL (ref 1.7–7.7)
Neutrophils Relative %: 39 %
Platelets: 237 10*3/uL (ref 150–400)
RBC: 4.03 MIL/uL (ref 3.87–5.11)
RDW: 12 % (ref 11.5–15.5)
WBC: 4.3 10*3/uL (ref 4.0–10.5)
nRBC: 0 % (ref 0.0–0.2)

## 2020-08-10 LAB — TROPONIN I (HIGH SENSITIVITY): Troponin I (High Sensitivity): 2 ng/L (ref <18)

## 2020-08-10 NOTE — ED Provider Notes (Signed)
[facility] EMERGENCY DEPARTMENT Provider Note   CSN: [id number] Arrival date & time: 08/10/20  1804     History Chief Complaint  Patient presents with  . Chest Pain    Sheila Bates is a 34 y.o. female.  Patient presenting with substernal chest pain that radiates to the back associate with lightheadedness and blurred vision ongoing for longer than a month.  But for the past month is been consistent every day intermittent in nature longest it lasted 2 hours.  Previously patient has seen primary care doctor about this and has not seen cardiology.  Patient states that they were unable to find anything significant.  But she keeps having this pain.  Vital signs upon upon present presentation here temp 98.5 heart rate 77 respirations 18 blood pressure 140/100 oxygen saturations are 100%.        Past Medical History:  Diagnosis Date  . 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 Rheumatoid arthritis (HCC)    mild affecting hands  . Vaginal Pap smear, abnormal   . Vitamin D deficiency     Patient Active Problem List   Diagnosis Date Noted  . Essential hypertension 09/23/2019  . Tachycardia 09/23/2019  . Rheumatoid arthritis (HCC) 09/23/2019  . History of cesarean section complicating pregnancy 04/28/2018  . Status post repeat low transverse cesarean section 04/28/2018  . Arrest of descent, delivered, current hospitalization 05/13/2014  . Other specified indication for care or intervention related to labor and delivery, unspecified as to episode of care 05/11/2014  . Cellulitis and abscess 01/05/2014  . Lupus (HCC) 09/20/2013    Past Surgical History:  Procedure Laterality Date  . BARTHOLIN GLAND CYST EXCISION    . CESAREAN SECTION N/A 05/12/2014   Procedure: Primary Cesarean Section Delivery Baby Girl @ 2353, Apgars;  Surgeon: Lavina Hamman, MD;  Location: [facility];  Service: Obstetrics;  Laterality: N/A;  . CESAREAN SECTION N/A 04/28/2018   Procedure:  REPEAT CESAREAN SECTION;  Surgeon: Huel Cote, MD;  Location: [facility] BIRTHING SUIT[facility];  Service: Obstetrics;  Laterality: N/A;  MD request RNFA  . I & D EXTREMITY     inguinal draining during pregnancy  . MYRINGOTOMY    . WISDOM TOOTH EXTRACTION       OB History    Gravida  4   Para  2   Term  2   Preterm  0   AB  2   Living  2     SAB  1   TAB  1   Ectopic  0   Multiple  0   Live Births  2           Family History  Problem Relation Age of Onset  . Hypertension Father   . Diabetes Father   . Lupus Maternal Aunt   . Breast cancer Paternal Aunt   . Lupus Maternal Grandmother   . Diabetes Paternal Grandmother     Social History   Tobacco Use  . Smoking status: Never Smoker  . Smokeless tobacco: Never Used  Vaping Use  . Vaping Use: Never used  Substance Use Topics  . Alcohol use: Yes    Comment: weekly  . Drug use: No    Home Medications Prior to Admission medications   Medication Sig Start Date End Date Taking? Authorizing Provider  amLODipine (NORVASC) 2.5 MG tablet TAKE 1 TABLET BY MOUTH EVERY DAY 01/12/19   Nafziger, Kandee Keen, NP  famotidine (PEPCID) 20 MG tablet Take  1 tablet (20 mg total) by mouth daily. 08/04/19   Little, Ambrose Finland, MD  NON FORMULARY sea moss    [provider]  Vitamin D, Ergocalciferol, (DRISDOL) 1.25 MG (50000 UT) CAPS capsule TAKE 1 CAPSULE (50,000 UNITS TOTAL) BY MOUTH EVERY 7 (SEVEN) DAYS. 07/07/19   [name], NP    Allergies    Patient has no known allergies.  Review of Systems   Review of Systems  Constitutional: Negative for chills and fever.  HENT: Negative for congestion, rhinorrhea and sore throat.   Eyes: Negative for visual disturbance.  Respiratory: Negative for cough and shortness of breath.   Cardiovascular: Positive for chest pain. Negative for leg swelling.  Gastrointestinal: Negative for abdominal pain, diarrhea, nausea and vomiting.  Genitourinary: Negative for dysuria.    Musculoskeletal: Positive for back pain. Negative for neck pain.  Skin: Negative for rash.  Neurological: Negative for dizziness, light-headedness and headaches.  Hematological: Does not bruise/bleed easily.  Psychiatric/Behavioral: Negative for confusion.    Physical Exam Updated Vital Signs BP (!) 140/100   Pulse 77   Temp 98.5 F (36.9 C) (Oral)   Resp 18   Ht 1.575 m (5\' 2" )   Wt 70.3 kg   LMP 07/28/2020   SpO2 100%   BMI 28.35 kg/m   Physical Exam Vitals and nursing note reviewed.  Constitutional:      General: She is not in acute distress.    Appearance: Normal appearance. She is well-developed.  HENT:     Head: Normocephalic and atraumatic.  Eyes:     Extraocular Movements: Extraocular movements intact.     Conjunctiva/sclera: Conjunctivae normal.     Pupils: Pupils are equal, round, and reactive to light.  Cardiovascular:     Rate and Rhythm: Normal rate and regular rhythm.     Heart sounds: No murmur heard.   Pulmonary:     Effort: Pulmonary effort is normal. No respiratory distress.     Breath sounds: Normal breath sounds.  Chest:     Chest wall: No tenderness.  Abdominal:     Palpations: Abdomen is soft.     Tenderness: There is no abdominal tenderness.  Musculoskeletal:        General: No swelling. Normal range of motion.     Cervical back: Normal range of motion and neck supple.  Skin:    General: Skin is warm and dry.  Neurological:     General: No focal deficit present.     Mental Status: She is alert and oriented to person, place, and time.     Cranial Nerves: Cranial nerve deficit present.     Sensory: Sensory deficit present.     Motor: No weakness.     ED Results / Procedures / Treatments   Labs (all labs ordered are listed, but only abnormal results are displayed) Labs Reviewed  CBC WITH DIFFERENTIAL/PLATELET - Abnormal; Notable for the following components:      Result Value   Hemoglobin 11.4 (*)    All other components within  normal limits  COMPREHENSIVE METABOLIC PANEL  TROPONIN I (HIGH SENSITIVITY)    EKG EKG Interpretation  Date/Time:  Thursday August 10 2020 18:07:57 EDT Ventricular Rate:  85 PR Interval:  142 QRS Duration: 74 QT Interval:  366 QTC Calculation: 435 R Axis:   44 Text Interpretation: Normal sinus rhythm Cannot rule out Anterior infarct , age undetermined Abnormal ECG No significant change since last tracing Confirmed by Vanetta Mulders [phone]) on 08/10/2020 7:02:39 PM  Radiology DG Chest 2 View  Result Date: 08/10/2020 CLINICAL DATA:  Chest pain EXAM: CHEST - 2 VIEW COMPARISON:  08/04/2019 FINDINGS: The heart size and mediastinal contours are within normal limits. Both lungs are clear. The visualized skeletal structures are unremarkable. IMPRESSION: No active cardiopulmonary disease. Electronically Signed   By: Helyn Numbers MD   On: 08/10/2020 19:02    Procedures Procedures (including critical care time)  Medications Ordered in ED Medications - No data to display  ED Course  I have reviewed the triage vital signs and the nursing notes.  Pertinent labs & imaging results that were available during my care of the patient were reviewed by me and considered in my medical decision making (see chart for details).    MDM Rules/Calculators/A&P                         Work-up without any acute findings.  Patient with low clinical risk for significant coronary artery disease.  Has a history of lupus trait but is not known to have lupus.  Recommending patient follow back up with primary care doctor for consideration for further evaluation.  Also they could have a discussion with cardiology to see if they want to take deeper as potential cause of chest pain.  Patient's work-up here today without any acute findings.  Final Clinical Impression(s) / ED Diagnoses Final diagnoses:  Atypical chest pain    Rx / DC Orders ED Discharge Orders    None       Vanetta Mulders,  MD 08/10/20 2035

## 2020-08-10 NOTE — Discharge Instructions (Addendum)
Work-up for the chest pain here today without any acute findings.  Recommend following back up with your primary care doctor.  For further discussion about more detailed outpatient work-up.  Also some consideration whether cardiology wants to take deeper to find the cause of the chest pain.

## 2020-08-10 NOTE — ED Triage Notes (Addendum)
C/o med sternal cp x 3 month , denies n/v

## 2020-08-21 ENCOUNTER — Other Ambulatory Visit: Payer: Self-pay

## 2020-08-21 ENCOUNTER — Ambulatory Visit: Payer: 59 | Admitting: Cardiology

## 2020-08-21 ENCOUNTER — Encounter: Payer: Self-pay | Admitting: Cardiology

## 2020-08-21 VITALS — BP 141/92 | HR 91 | Ht 62.0 in | Wt 152.0 lb

## 2020-08-21 DIAGNOSIS — R072 Precordial pain: Secondary | ICD-10-CM

## 2020-08-21 DIAGNOSIS — I10 Essential (primary) hypertension: Secondary | ICD-10-CM

## 2020-08-21 NOTE — Progress Notes (Signed)
° ° °  Patient referred by Lin Landsman, MD for tachycardia  Subjective:   Sheila Bates, female    DOB: [birth year], 34 y.o.   MRN: [id number]  Chief Complaint  Patient presents with   Chest Pain     HPI  34 year old African-American female with hypertension, chest pain  Patient was last seen by me 09/2019 for complaints of palpitations. Work-up was unremarkable other than grade 2 mitral regurgitation, and one episode of 7 beat V. Tach. Patient was recently seen at [facility] for complaints of chest pain. Work-up including EKG, serial troponin was unremarkable. Chest pain is retrosternal, sharp, unrelated to exertion.   No current outpatient medications on file prior to visit.   No current facility-administered medications on file prior to visit.    Cardiovascular studies:  EKG 08/21/2020: Sinus rhythm 76 bpm Nonspecific T-abnormality  Echocardiogram 10/07/2019:  Left ventricle cavity is normal in size. Normal left ventricular wall  thickness. Normal LV systolic function with EF 57%. Normal global wall  motion. Normal diastolic filling pattern.  Moderate (Grade II) mitral regurgitation.  Mild tricuspid regurgitation.  Mild pulmonic regurgitation.  No evidence of pulmonary hypertension.  Event monitor 10/12/2019 - 11/10/2019: Diagnostic time: 92%  Dominant rhythm: Sinus. HR 60-147 bpm. Avg HR 84 bpm. 7 beat Vtach at 4:41 AM CT on 11/03/2019. Occasional PVC seen. No other significant arrhythmia noted. No symptoms reported.  Recent labs: 08/10/2020: Glucose 95, BUN/Cr 12/0.64. EGFR >60. Na/K 136/3.7. Rest of the CMP normal H/H 11/36. MCV 89. Platelets 237  12/2018: Chol 148, TG 75, HDL 53, LDL 79    Review of Systems  Cardiovascular: Negative for chest pain, dyspnea on exertion, leg swelling, palpitations and syncope.         Vitals:   08/21/20 1052  BP: (!) 141/92  Pulse: 91  SpO2: 98%     Body mass index is 27.8 kg/m. Filed Weights    08/21/20 1052  Weight: 152 lb (68.9 kg)     Objective:   Physical Exam Vitals and nursing note reviewed.  Constitutional:      General: She is not in acute distress. Neck:     Vascular: No JVD.  Cardiovascular:     Rate and Rhythm: Normal rate and regular rhythm.     Heart sounds: Normal heart sounds. No murmur heard.   Pulmonary:     Effort: Pulmonary effort is normal.     Breath sounds: Normal breath sounds. No wheezing or rales.           Assessment & Recommendations:   34 year old African-American female with hypertension, chest pain  Chest pain: Unlikely to be angina.  Recommend exercise treadmill stress test.  After stress test, she may benefit from being on low-dose beta-blocker for treatment of both hypertension and palpitations.  Follow-up as needed  Nigel Mormon, MD [facility] Pager: [phone] Office: [phone]

## 2020-09-01 ENCOUNTER — Other Ambulatory Visit (HOSPITAL_COMMUNITY)
Admission: RE | Admit: 2020-09-01 | Discharge: 2020-09-01 | Disposition: A | Discharge status: Home / Self Care | Payer: 59 | Source: Ambulatory Visit | Attending: Cardiology | Admitting: Cardiology

## 2020-09-01 DIAGNOSIS — Z20822 Contact with and (suspected) exposure to covid-19: Secondary | ICD-10-CM | POA: Diagnosis not present

## 2020-09-01 DIAGNOSIS — Z01812 Encounter for preprocedural laboratory examination: Secondary | ICD-10-CM | POA: Insufficient documentation

## 2020-09-02 LAB — SARS CORONAVIRUS 2 (TAT 6-24 HRS): SARS Coronavirus 2: NEGATIVE

## 2020-09-04 ENCOUNTER — Other Ambulatory Visit: Payer: Self-pay

## 2020-09-04 ENCOUNTER — Ambulatory Visit: Payer: 59

## 2020-09-04 DIAGNOSIS — R072 Precordial pain: Secondary | ICD-10-CM

## 2020-10-26 ENCOUNTER — Other Ambulatory Visit: Payer: Self-pay

## 2020-10-26 DIAGNOSIS — Z20822 Contact with and (suspected) exposure to covid-19: Secondary | ICD-10-CM

## 2020-10-28 LAB — SARS-COV-2, NAA 2 DAY TAT

## 2020-10-28 LAB — NOVEL CORONAVIRUS, NAA: SARS-CoV-2, NAA: NOT DETECTED

## 2020-12-05 ENCOUNTER — Encounter: Payer: Self-pay | Admitting: Neurology

## 2020-12-21 NOTE — Progress Notes (Signed)
NEUROLOGY CONSULTATION NOTE  Sheila Bates MRN: [id number] DOB: 1986-05-19  Referring provider: Leilani Able, MD Primary care provider: Leilani Able, MD  Reason for consult:  vertigo   Subjective:  Sheila Bates is a 35 year old right-handed female with HTN and who presents for vertigo.  History supplemented by referring provider's note.  Since at least early 2020, she has experienced episodes of dizziness and blurred vision.  The dizziness is typically a lightheadedness but rarely spinning.  It happens with change in position but also may occur spontaneously.  It lasts a few seconds.  It occurs one to three times a month.  Sometimes she has blurred vision in both eyes.  It lasts an entire day and occurs once a week.  She has a history of a right retinal hemorrhage.  She followed up with her eye doctor who told her her eyes looked fine.  Since late 2021, she has been having headaches.  They are 5/10 pressure around the eyes or right side of head.  No associated nausea, vomiting, photophobia, phonophobia or typically visual disturbance.  They last 1 to 2 hours and occurs on average 10 days a month.  She treats with ibuprofen.  Usually all of these symptoms occur independently but sometimes they may occur together.   She has history of HTN but blood pressure has been normal.  She denies history of migraines.  She also has history of palpitations and atypical chest pain with unremarkable cardiac workup.    CBC and CMP from 08/10/20 normal except for borderline low HGB of 11.4.  PAST MEDICAL HISTORY: Past Medical History:  Diagnosis Date  . 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 Rheumatoid arthritis (HCC)    mild affecting hands  . Vaginal Pap smear, abnormal   . Vitamin D deficiency     PAST SURGICAL HISTORY: Past Surgical History:  Procedure Laterality Date  . BARTHOLIN GLAND CYST EXCISION    . CESAREAN SECTION N/A 05/12/2014   Procedure: Primary Cesarean Section  Delivery Baby Girl @ 2353, Apgars;  Surgeon: Sheila Hamman, MD;  Location: [facility];  Service: Obstetrics;  Laterality: N/A;  . CESAREAN SECTION N/A 04/28/2018   Procedure: REPEAT CESAREAN SECTION;  Surgeon: Sheila Cote, MD;  Location: [facility] BIRTHING SUITES;  Service: Obstetrics;  Laterality: N/A;  MD request RNFA  . I & D EXTREMITY     inguinal draining during pregnancy  . MYRINGOTOMY    . WISDOM TOOTH EXTRACTION      MEDICATIONS: No current outpatient medications on file prior to visit.   No current facility-administered medications on file prior to visit.    ALLERGIES: No Known Allergies  FAMILY HISTORY: Family History  Problem Relation Age of Onset  . Hypertension Father   . Diabetes Father   . Lupus Maternal Aunt   . Breast cancer Paternal Aunt   . Lupus Maternal Grandmother   . Diabetes Paternal Grandmother     SOCIAL HISTORY: Social History   Socioeconomic History  . Marital status: Married    Spouse name: Not on file  . Number of children: 2  . Years of education: Not on file  . Highest education level: Not on file  Occupational History  . Not on file  Tobacco Use  . Smoking status: Never Smoker  . Smokeless tobacco: Never Used  Vaping Use  . Vaping Use: Never used  Substance and Sexual Activity  . Alcohol use: Yes    Comment: weekly  .  Drug use: No  . Sexual activity: Not on file  Other Topics Concern  . Not on file  Social History Narrative  . Not on file   Social Determinants of Health   Financial Resource Strain: Not on file  Food Insecurity: Not on file  Transportation Needs: Not on file  Physical Activity: Not on file  Stress: Not on file  Social Connections: Not on file  Intimate Partner Violence: Not on file    Objective:  Blood pressure (!) 147/90, pulse 94, height 5\' 3"  (1.6 m), weight 164 lb 9.6 oz (74.7 kg), SpO2 100 %, unknown if currently breastfeeding. General: No acute distress.  Patient appears well-groomed.   Head:   Normocephalic/atraumatic Eyes:  fundi examined but not visualized Neck: supple, no paraspinal tenderness, full range of motion Back: No paraspinal tenderness Heart: regular rate and rhythm Lungs: Clear to auscultation bilaterally. Vascular: No carotid bruits. Neurological Exam: Mental status: alert and oriented to person, place, and time, recent and remote memory intact, fund of knowledge intact, attention and concentration intact, speech fluent and not dysarthric, language intact. Cranial nerves: CN I: not tested CN II: pupils equal, round and reactive to light, visual fields intact CN III, IV, VI:  full range of motion, no nystagmus, no ptosis CN V: facial sensation intact. CN VII: upper and lower face symmetric CN VIII: hearing intact CN IX, X: gag intact, uvula midline CN XI: sternocleidomastoid and trapezius muscles intact CN XII: tongue midline Bulk & Tone: normal, no fasciculations. Motor:  muscle strength 5/5 throughout Sensation:  Pinprick, temperature and vibratory sensation intact. Deep Tendon Reflexes:  2+ throughout,  toes downgoing.   Finger to nose testing:  Without dysmetria.   Heel to shin:  Without dysmetria.   Gait:  Normal station and stride.  Romberg negative.  Assessment/Plan:   1.  Headache 2.  Dizziness 3.  Intermittent blurred vision  Taken together, may consider migraine but low suspicion.  They typically occur independently.  Dizzy spells are brief, just seconds.  I think we should check for a secondary intracranial abnormality.  We can treat for possible migraines with antidepressant or antiepileptic, but patient declines for now.   1.  MRI of brain with and without contrast 2.  Further recommendations pending results.     Thank you for allowing me to take part in the care of this patient.  , DO  CC: Sheila Millet, MD

## 2020-12-22 ENCOUNTER — Ambulatory Visit: Payer: BC Managed Care – PPO | Admitting: Neurology

## 2020-12-22 ENCOUNTER — Other Ambulatory Visit: Payer: Self-pay

## 2020-12-22 ENCOUNTER — Encounter: Payer: Self-pay | Admitting: Neurology

## 2020-12-22 VITALS — BP 147/90 | HR 94 | Ht 63.0 in | Wt 164.6 lb

## 2020-12-22 DIAGNOSIS — H538 Other visual disturbances: Secondary | ICD-10-CM

## 2020-12-22 DIAGNOSIS — R519 Headache, unspecified: Secondary | ICD-10-CM | POA: Diagnosis not present

## 2020-12-22 DIAGNOSIS — R42 Dizziness and giddiness: Secondary | ICD-10-CM

## 2020-12-22 NOTE — Patient Instructions (Addendum)
Will check MRI of brain with and without contrast. We have sent a referral to [facility] for your MRI and they will call you directly to schedule your appointment. They are located at [address] [facility]. If you need to contact them directly please call [phone]. Further recommendations pending results.  If unremarkable, follow up as needed

## 2020-12-25 ENCOUNTER — Ambulatory Visit: Payer: Self-pay | Admitting: Neurology

## 2021-01-16 ENCOUNTER — Other Ambulatory Visit: Payer: BC Managed Care – PPO

## 2021-02-05 ENCOUNTER — Ambulatory Visit
Admission: RE | Admit: 2021-02-05 | Discharge: 2021-02-05 | Disposition: A | Discharge status: Home / Self Care | Payer: BC Managed Care – PPO | Source: Ambulatory Visit | Attending: Neurology | Admitting: Neurology

## 2021-02-05 ENCOUNTER — Other Ambulatory Visit: Payer: Self-pay

## 2021-02-05 DIAGNOSIS — R42 Dizziness and giddiness: Secondary | ICD-10-CM

## 2021-02-05 DIAGNOSIS — R519 Headache, unspecified: Secondary | ICD-10-CM

## 2021-02-05 DIAGNOSIS — H538 Other visual disturbances: Secondary | ICD-10-CM

## 2021-02-05 MED ORDER — GADOBENATE DIMEGLUMINE 529 MG/ML IV SOLN
14.0000 mL | Freq: Once | INTRAVENOUS | Status: AC | PRN
  Administered 2021-02-05: 14 mL via INTRAVENOUS

## 2021-02-07 ENCOUNTER — Ambulatory Visit: Payer: Self-pay | Admitting: Neurology

## 2021-07-15 IMAGING — MR MR HEAD WO/W CM
13 series · 48 of 48 positions shown · IV contrast (multihance)
Comparison: None.

CLINICAL DATA: Headaches

EXAM:
MRI HEAD WITHOUT AND WITH CONTRAST
TECHNIQUE: Multiplanar, multiecho pulse sequences of the brain and surrounding
structures were obtained without and with intravenous contrast.
CONTRAST:  14mL MULTIHANCE GADOBENATE DIMEGLUMINE 529 MG/ML IV SOLN

[Series 13: T1 · sagittal · 4.0mm · 0.75mm/px · 2 of 31 slices shown (1 of 3)]
[im 1/31]
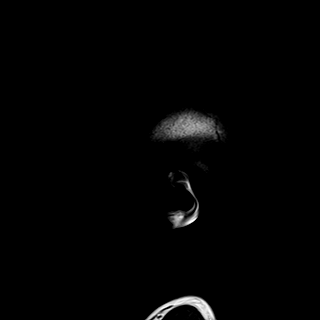
[im 31/31]
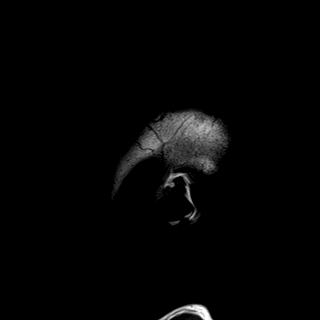

[Series 14: DWI · axial · 3.0mm · 0.94mm/px · z∈[-111,+27]mm · 8 of 160 slices shown (1 of 3)]
[im 1/160]
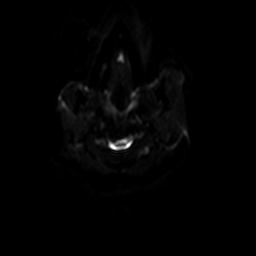
[im 23/160]
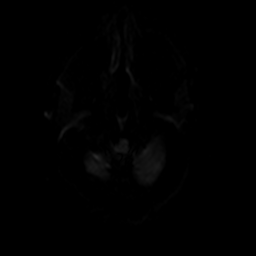
[im 46/160]
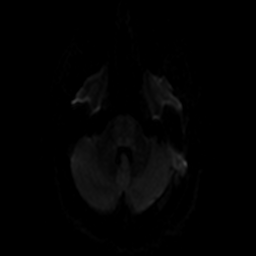
[im 69/160]
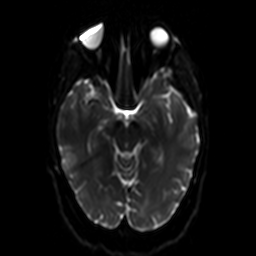
[im 91/160]
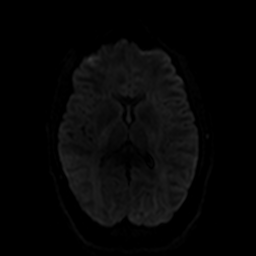
[im 114/160]
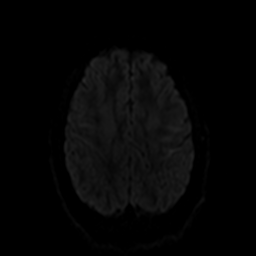
[im 137/160]
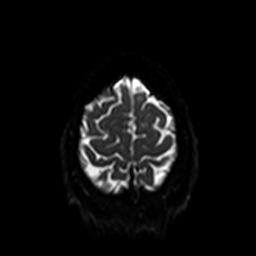
[im 160/160]
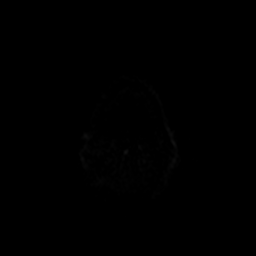

[Series 15: ax dwi_tracew · axial · 3.0mm · 0.94mm/px · z∈[-111,+27]mm · 4 of 80 slices shown]
[im 1/80]
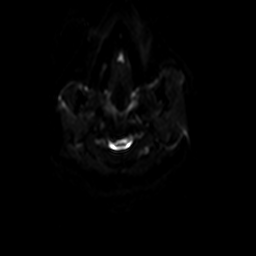
[im 27/80]
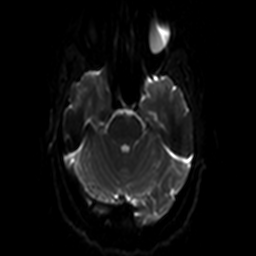
[im 53/80]
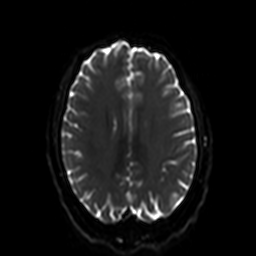
[im 80/80]
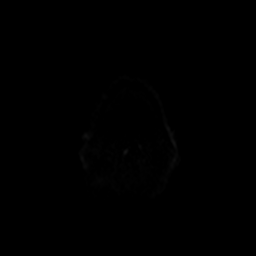

[Series 16: ax dwi_adc · axial · 3.0mm · 0.94mm/px · z∈[-111,+27]mm · 2 of 40 slices shown]
[im 1/40]
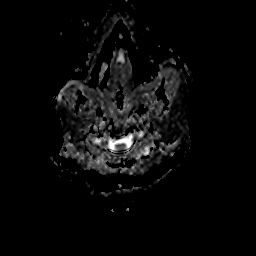
[im 40/40]
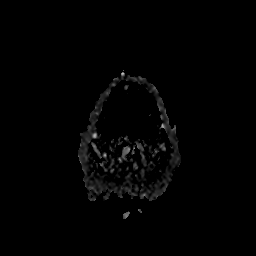

[Series 17: DWI · coronal · 5.0mm · 1.44mm/px · 3 of 60 slices shown (2 of 3)]
[im 1/60]
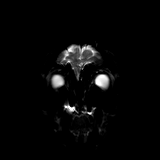
[im 30/60]
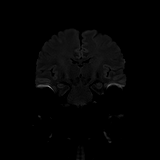
[im 60/60]
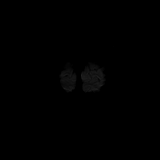

[Series 18: DWI · coronal · 5.0mm · 1.44mm/px · 2 of 30 slices shown (3 of 3)]
[im 1/30]
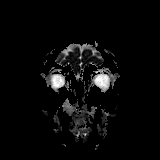
[im 30/30]
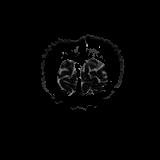

[Series 19: T2 · axial · 4.0mm · 0.36mm/px · 1 of 27 slices shown]
[im 1/27]
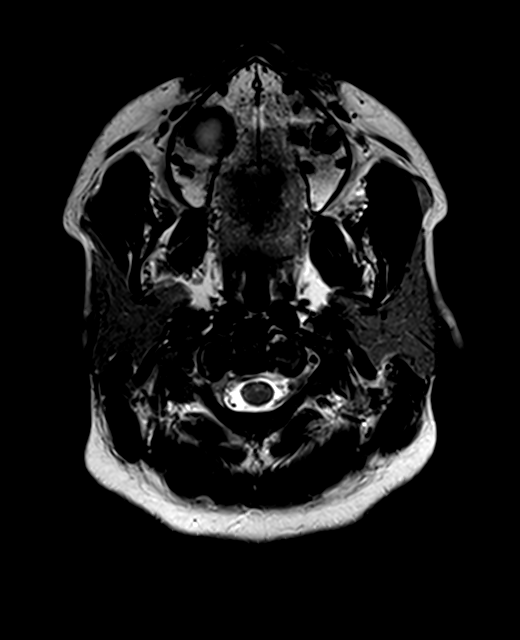

[Series 20: FLAIR · axial · 3.0mm · 0.72mm/px · 1 of 26 slices shown]
[im 1/26]
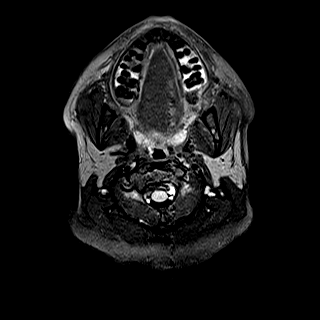

[Series 22: swi_images · axial · 1.5mm · 0.90mm/px · z∈[-103,+37]mm · 5 of 96 slices shown]
[im 1/96]
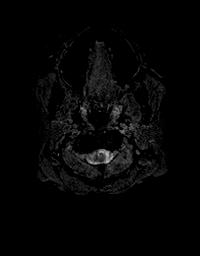
[im 24/96]
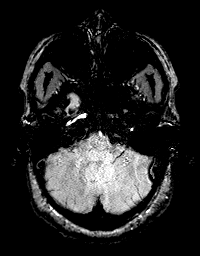
[im 48/96]
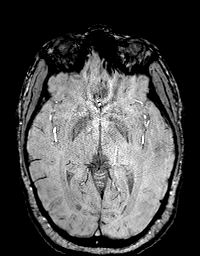
[im 72/96]
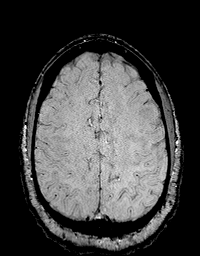
[im 96/96]
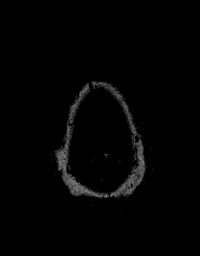

[Series 23: T1 · axial · 1.0mm · 0.94mm/px · z∈[-117,+37]mm · 8 of 160 slices shown (2 of 3)]
[im 1/160]
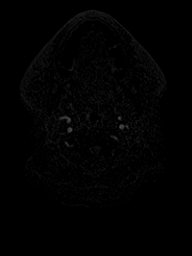
[im 23/160]
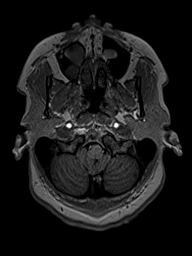
[im 46/160]
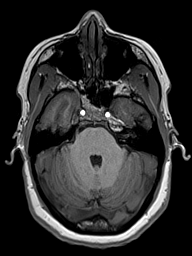
[im 69/160]
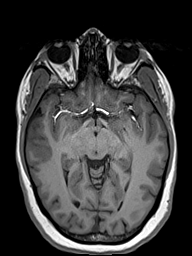
[im 91/160]
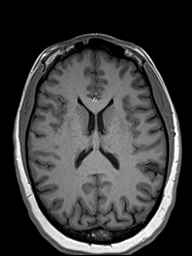
[im 114/160]
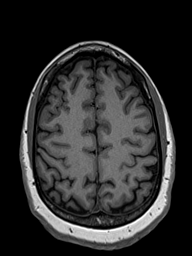
[im 137/160]
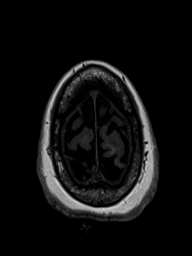
[im 160/160]
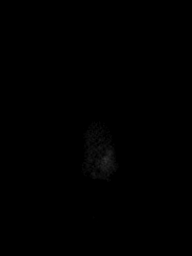

[Series 24: T2 post-contrast · coronal · 4.5mm · 0.36mm/px · 2 of 35 slices shown]
[im 1/35]
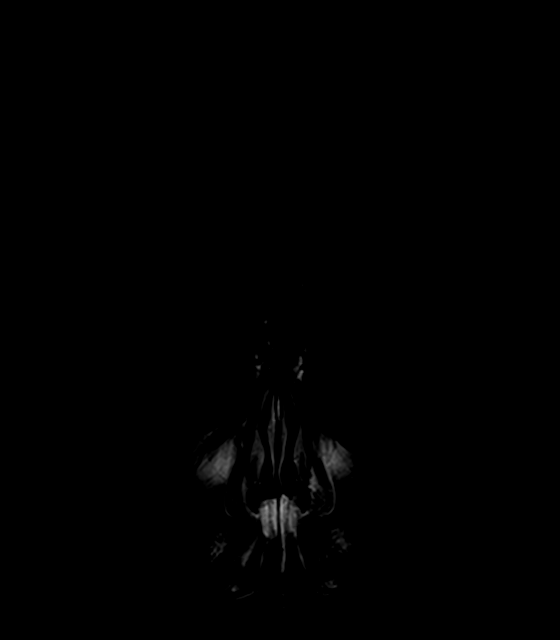
[im 35/35]
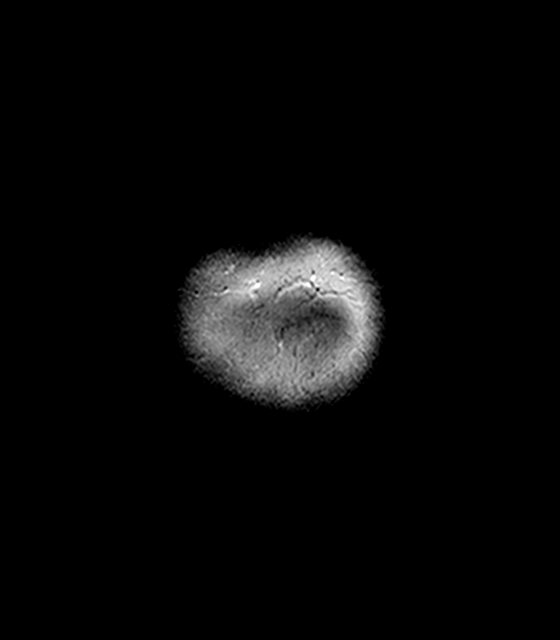

[Series 25: T1 · axial · 1.0mm · 0.94mm/px · z∈[-117,+37]mm · 8 of 160 slices shown (3 of 3)]
[im 1/160]
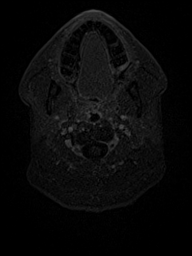
[im 23/160]
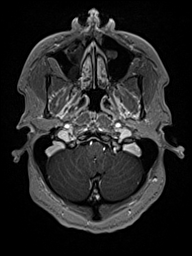
[im 46/160]
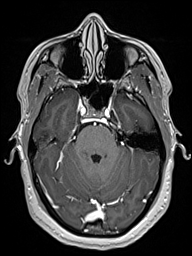
[im 69/160]
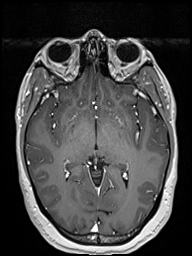
[im 91/160]
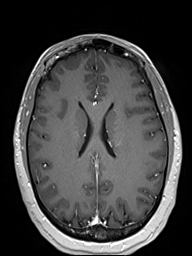
[im 114/160]
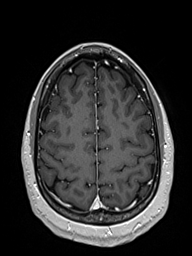
[im 137/160]
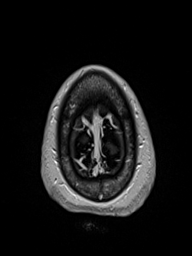
[im 160/160]
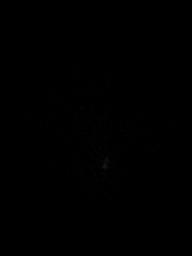

[Series 26: T1 post-contrast · coronal · 4.5mm · 0.72mm/px · 2 of 35 slices shown]
[im 1/35]
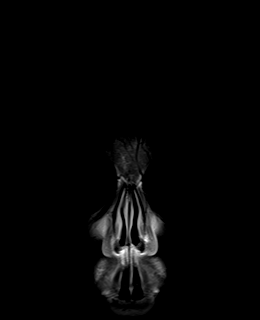
[im 35/35]
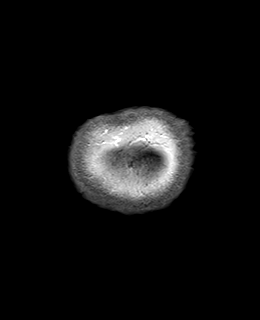

[48 of 48 positions shown; findings below may reference images not displayed]

FINDINGS: Brain: There is no acute infarction or intracranial hemorrhage.
There is no intracranial mass, mass effect, or edema. There is no
hydrocephalus or extra-axial fluid collection. Ventricles and sulci
are normal in size and configuration. Punctate focus of T2
hyperintensity in the right frontal subcortical white matter likely
reflects nonspecific gliosis/demyelination of doubtful clinical
significance and was present previously. Cerebellar tonsils extend
minimally below the foramen magnum without additional evidence of
Chiari malformation. No abnormal enhancement.

Vascular: Major vessel flow voids at the skull base are preserved.

Skull and upper cervical spine: Marrow signal is within normal
limits.

Sinuses/Orbits: Few maxillary sinus retention cysts or polyps.
Orbits are unremarkable.

Other: Sella is unremarkable.  Mastoid air cells are clear.
IMPRESSION: No intracranial mass, abnormal enhancement, or other significant
abnormality.
